# Patient Record
Sex: Male | Born: 1945 | Race: Black or African American | Hispanic: No | Smoking: Light tobacco smoker
Health system: Southern US, Community
[De-identification: ages and names within clinical notes are randomized; demographics above are authoritative.]

## PROBLEM LIST (undated history)

## (undated) DIAGNOSIS — G4733 Obstructive sleep apnea (adult) (pediatric): Secondary | ICD-10-CM

## (undated) DIAGNOSIS — J449 Chronic obstructive pulmonary disease, unspecified: Secondary | ICD-10-CM

## (undated) DIAGNOSIS — E119 Type 2 diabetes mellitus without complications: Secondary | ICD-10-CM

## (undated) DIAGNOSIS — F32A Depression, unspecified: Secondary | ICD-10-CM

## (undated) DIAGNOSIS — F0390 Unspecified dementia without behavioral disturbance: Secondary | ICD-10-CM

## (undated) DIAGNOSIS — I639 Cerebral infarction, unspecified: Secondary | ICD-10-CM

## (undated) DIAGNOSIS — I739 Peripheral vascular disease, unspecified: Secondary | ICD-10-CM

## (undated) DIAGNOSIS — I10 Essential (primary) hypertension: Secondary | ICD-10-CM

## (undated) DIAGNOSIS — N189 Chronic kidney disease, unspecified: Secondary | ICD-10-CM

## (undated) DIAGNOSIS — E785 Hyperlipidemia, unspecified: Secondary | ICD-10-CM

## (undated) DIAGNOSIS — I4891 Unspecified atrial fibrillation: Secondary | ICD-10-CM

## (undated) DIAGNOSIS — F329 Major depressive disorder, single episode, unspecified: Secondary | ICD-10-CM

## (undated) HISTORY — PX: HIP FRACTURE SURGERY: SHX118

## (undated) HISTORY — DX: Chronic kidney disease, unspecified: N18.9

## (undated) HISTORY — DX: Type 2 diabetes mellitus without complications: E11.9

## (undated) HISTORY — DX: Obstructive sleep apnea (adult) (pediatric): G47.33

---

## 1999-07-10 ENCOUNTER — Inpatient Hospital Stay (HOSPITAL_COMMUNITY): Admission: AD | Admit: 1999-07-10 | Discharge: 1999-07-15 | Payer: Self-pay | Admitting: Neurology

## 1999-07-11 ENCOUNTER — Encounter: Payer: Self-pay | Admitting: Neurology

## 1999-07-12 ENCOUNTER — Encounter: Payer: Self-pay | Admitting: Neurology

## 1999-07-15 ENCOUNTER — Inpatient Hospital Stay (HOSPITAL_COMMUNITY)
Admission: RE | Admit: 1999-07-15 | Discharge: 1999-10-19 | Payer: Self-pay | Admitting: Physical Medicine & Rehabilitation

## 1999-07-15 ENCOUNTER — Encounter: Payer: Self-pay | Admitting: Physical Medicine & Rehabilitation

## 1999-10-17 ENCOUNTER — Encounter: Payer: Self-pay | Admitting: Physical Medicine & Rehabilitation

## 2003-05-13 ENCOUNTER — Encounter
Admission: RE | Admit: 2003-05-13 | Discharge: 2003-08-11 | Payer: Self-pay | Admitting: Physical Medicine & Rehabilitation

## 2003-08-12 ENCOUNTER — Encounter
Admission: RE | Admit: 2003-08-12 | Discharge: 2003-11-10 | Payer: Self-pay | Admitting: Physical Medicine & Rehabilitation

## 2003-12-30 ENCOUNTER — Encounter
Admission: RE | Admit: 2003-12-30 | Discharge: 2004-03-02 | Payer: Self-pay | Admitting: Physical Medicine & Rehabilitation

## 2003-12-31 ENCOUNTER — Ambulatory Visit: Payer: Self-pay | Admitting: Physical Medicine & Rehabilitation

## 2004-03-02 ENCOUNTER — Encounter
Admission: RE | Admit: 2004-03-02 | Discharge: 2004-05-31 | Payer: Self-pay | Admitting: Physical Medicine & Rehabilitation

## 2004-03-04 ENCOUNTER — Ambulatory Visit: Payer: Self-pay | Admitting: Physical Medicine & Rehabilitation

## 2004-06-01 ENCOUNTER — Encounter
Admission: RE | Admit: 2004-06-01 | Discharge: 2004-08-30 | Payer: Self-pay | Admitting: Physical Medicine & Rehabilitation

## 2004-07-06 ENCOUNTER — Ambulatory Visit: Payer: Self-pay | Admitting: Physical Medicine & Rehabilitation

## 2004-12-28 ENCOUNTER — Encounter
Admission: RE | Admit: 2004-12-28 | Discharge: 2005-03-28 | Payer: Self-pay | Admitting: Physical Medicine & Rehabilitation

## 2004-12-28 ENCOUNTER — Ambulatory Visit: Payer: Self-pay | Admitting: Physical Medicine & Rehabilitation

## 2005-07-26 ENCOUNTER — Encounter
Admission: RE | Admit: 2005-07-26 | Discharge: 2005-10-24 | Payer: Self-pay | Admitting: Physical Medicine & Rehabilitation

## 2005-07-26 ENCOUNTER — Ambulatory Visit: Payer: Self-pay | Admitting: Physical Medicine & Rehabilitation

## 2010-01-06 ENCOUNTER — Emergency Department (HOSPITAL_COMMUNITY): Admission: EM | Admit: 2010-01-06 | Discharge: 2010-01-06 | Payer: Self-pay | Admitting: Emergency Medicine

## 2010-07-10 NOTE — Assessment & Plan Note (Signed)
HISTORY:  The patient is an old patient of mine who was last seen in March  2003 for ongoing spasticity in the left side due to a stroke.  The patient  had some successful Botox injections.  I have not seen him back since March  2003.  He is still at the [facility] receiving 24-hour care.  He is able  to walk with assistance and perform some of his ADL's, including toileting  independently.  He needs help with some grooming tasks and his dressing.  He  continues to have pain in the left shoulder which is due to tone and  adhesive capsulitis.  He also has ongoing problems with the left elbow due  to chronic tonicity.   CURRENT MEDICATIONS:  He is currently taking Zanaflex for spasticity, and  his dose is 8 mg b.i.d.  He is not using anything for p.r.n. spasms.  He  remains on chronic Coumadin for anticoagulation.  The patient has some tightness in the left leg as well, but this does not  seem to effect him as the tone in his left upper extremity does.   REVIEW OF SYSTEMS:  The patient denies any chest pain, shortness of breath,  coughing, wheezing, cold or flu symptoms.  He has had no seizures, new  weakness, numbness or dizziness.  No vertigo, confusion, problems with  vision.  Mood has been stable.  He denies any problems with sleep or  agitation.  He has had no nausea or vomiting, reflux, diarrhea, or  constipation.  No recent fevers, weight loss, or weight gain.   PHYSICAL EXAMINATION:  GENERAL:  The patient is pleasant and in no acute  distress.  VITAL SIGNS:  Blood pressure 124/76, pulse 65, saturation 100% on room air.  NEUROLOGIC:  The patient is in his wheelchair.  He had a hard time  performing transfers on his limb.  His affect was flat as it usually has  been.  His appearance was normal generally.  He had an Geophysicist/field seismologist with him  today.  His left upper extremity tone ranged from 1+ to 2/4.  There was  impaired range of motion of the left shoulder in all planes.  He had  definite tightness at the left pectoralis major as well as the left biceps  brachi.  Wrist and finger flexors were a little less hypertonic today.  Motor strength was in the range of 1+ to 2/5 throughout the left upper  extremity.  The left lower extremity strength is trace to absent.  He had  trace to absent motor function distally proximally and 2/5 strength.  The  tone was 1 to 1+/4.  Sensory examination revealed 1/2 sensation to pinprick  and light touch on the left side.  Reflexes were hyperactive throughout.  The remainder of his neurological examination was normal, including the  cranial nerves today.  Cognitive ___________ appropriate, although he was  flat.   ASSESSMENT:  1. Status post right cerebrovascular accident.  2. Spastic left hemiparesis.  3. Ongoing flat affect.   PLAN:  1. We will stop the Zanaflex today.  2. Begin him on Dantrium 25 mg b.i.d. and increase to 25 mg t.i.d. after one     week.  3. I will set him up for Botox injections of the left biceps and left     pectoralis major using 300 units in total.  4. I would like to follow up the injections with therapy, which is     apparently available  at the [facility].      Ranelle Oyster, M.D.   ZTS/MedQ  D:  05/14/2003 12:54:01  T:  05/14/2003 14:26:22  Job #:  [id number]   cc:   [facility]

## 2010-07-10 NOTE — Assessment & Plan Note (Signed)
Derrick Howell is back regarding his left spastic hemiparesis. He has failed multiple  other measures in the past over the long-term because of the lack of  initiation. Derrick Howell comes back today without significant change. He remains on  Dantrium 25 mg q.i.d. He is up at a very limited basis at the [facility]  where he lives. He has some one walk him on occasion. He has limited social  contacts in general. He remains on Lexapro for mood.   The patient denies pain today. He is sleeping well. Feels like his mood has  been stable.   REVIEW OF SYSTEMS:  The patient reports trouble walking. Labile sugars. He  denies any new constitutional, GI, GU, or cardiorespiratory complaints  today.   SOCIAL HISTORY:  Without significant change.   PHYSICAL EXAMINATION:  Blood pressure 126/65, pulse 68, respiratory rate 16,  saturation 99% on room air. The patient is pleasant, flat, and in no acute  distress. He is alert and oriented times three. Tone remains stable in the  left biceps at 2/4. Strength in the left upper extremity is 1/5. The left  lower extremity tone is 1/4 with 2 to 3 out of 5 strength. The patient is  able to stand unassisted today from a wheelchair. Cranial nerves exam is  stable. Heart has a regular rate and rhythm. Lungs are clear.   ASSESSMENT:  1.  Status post right cerebrovascular accident.  2.  Spastic left hemiparesis.  3.  Depression.   PLAN:  1.  Taper off Dantrium over the next months' time as the patient is not      benefiting from oral medications. I will not seek further injections as      he does not follow through therapy-wise afterwards.  2.  Continue Lexapro for mood.  3.  The patient may follow up for further medical needs through a physician      at the [facility].      Ranelle Oyster, M.D.  Electronically Signed     ZTS/MedQ  D:  07/26/2005 16:15:05  T:  07/27/2005 12:20:47  Job #:  [id number]

## 2010-07-10 NOTE — Assessment & Plan Note (Signed)
Derrick Howell is back regarding his left sided spastic hemiparesis. He has had some  mild results with the left biceps injection and had good results with the  left pectoralis major injection. He has been getting some therapy at the  [facility] to address this. It does not sound like they have been overly  aggressive with the treatment, however. The patient denies pain. He is  tolerating the Dantrium at 25 mg t.i.d. currently without side effects.   REVIEW OF SYSTEMS:  The patient denies any chest pain, shortness of breath,  cold, flu, wheezing, or coughing symptoms. He denies seizures, weakness, new  spasms, stroke, vertigo, confusion, problem with depression, anxiety, or  agitation. Denies nausea, vomiting, problems with urination, diarrhea,  constipation, bowel or bladder incontinence. The patient denies fevers,  chills, weight changes, or sweating.   PHYSICAL EXAMINATION:  Blood pressure 133/69, pulse 85, respiratory rate 22,  saturating 97% on room air. The patient is seated in his wheelchair. Affect  remains overly flat but patient laughs at times. Assistant accompanies him  today. Tone still ranged in the 1+ to 2/4 range. Left shoulder and  pectoralis major were much improved from last visit. I felt that there was  mild improvement with biceps which I was able to range to neutral with  significant resistance. Motor strength remained 1+ to 2+/5 in the left upper  extremity. Left lower extremity remained 2+ to 3/5 with trace tone to 1/4  tone. Sensory exam was 1/2 for pin prick and light touch in the left.  Reflexes remain hyperactive on the left. Cranial nerve exam was intact.  Cognitive function appeared fairly appropriate although patient remains  flat.   ASSESSMENT:  1. Status post right cerebral vascular accident.  2. Spastic left hemiparesis.  3. Depression.   PLAN:  1. Continue with the Dantrium at 25 mg but increase to q.i.d. schedule.  2. Will repeat Botox injections, left  biceps, and left pectoralis major     using 300 units total. May consider phenol injection to the biceps at a     later point.  3. Continue with therapies at the [facility].  4. Will see the patient back in about one month for his followup injections.      Ranelle Oyster, M.D.   ZTS/MedQ  D:  08/13/2003 14:18:34  T:  08/14/2003 07:55:12  Job #:  [id number]

## 2010-07-10 NOTE — Procedures (Signed)
NAME:  Derrick Howell, Derrick Howell NO.:  [id number]   MEDICAL RECORD NO.:  [id number]          PATIENT TYPE:  REC   LOCATION:  [facility]                          FACILITY:  [facility]   PHYSICIAN:  Ranelle Oyster, M.D.DATE OF BIRTH:  [birth year]   DATE OF PROCEDURE:  03/04/2004  DATE OF DISCHARGE:                                 OPERATIVE REPORT   DIAGNOSIS:  Spastic left hemiparesis, ICD-9 342.12.   PROCEDURE:  Phenol injection.   DESCRIPTION OF PROCEDURE:  Left axilla was prepped with Betadine and  alcohol.  We identified the brachial artery and the surface electrode, the  musculocutaneous nerve's general area.  We inserted a 50 mm, 26-gauge  injectable electrode anterior to the brachial artery until we localized the  musculocutaneous nerve at the smallest possible amplitude.  After needle  aspiration, we injected 5 mL of aqueous phenol solution into the area  surrounding the musculocutaneous nerve.  The patient had instant relief with  decreased spasms in the arm and biceps in particular.  The patient was  cleaned and the area was not dressed as no drainage was noted.  He was given  post-injection instructions.   I will see the patient back in approximately three months' time.      Ian Malkin   ZTS/MEDQ  D:  03/04/2004 13:00:09  T:  03/04/2004 13:18:15  Job:  [id number]
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
## 2010-07-10 NOTE — Assessment & Plan Note (Signed)
MEDICAL RECORD NUMBER:  [id number]   Mr. Derrick Howell is back regarding his left spastic hemiparesis. We performed  phenol injections last spring with good results. The following May he was  still doing fairly well in regards to his tone. He is now here for a 6 month  follow-up where he is doing a lot of the same things he was dong before from  a standpoint of minimal exercise. He lacks motivation. He continues on  Lexapro for his mood. These issues have been problems ever since I followed  this patient even on inpatient rehab. He is doing some token stretches to  the left upper extremity and doing some walking on occasion but it does not  really amount to much.   The patient denies any pain. He states that he is sleeping well. He remains  on Dantrium 25 mg t.i.d. for tone control, as well as Lexapro 10 mg daily.  He is on other medications per physician at the nursing home.   REVIEW OF SYSTEMS:  Negative for any new neurological, psychiatric,  constitutional, GU, GI, or cardiorespiratory complaints. The patient denies  that he is depressed.   SOCIAL HISTORY:  The patient remains in the nursing home and has made no  attempts to leave the facility.   PHYSICAL EXAMINATION:  Blood pressure is 123/69, pulse is 98, respiratory  rate is 16, he is saturating 97% in room air. The patient is pleasant, in no  acute distress. He is alert and oriented x3. Affect is flat. He appears  fairly alert. The patient is in a wheelchair today. He has significant tone  along the left leg and arm today at varying levels of trace to 2+/4. His  volitional movement of the left upper extremity is 1/5. Left lower extremity  is 1-2/5. Left biceps tone had worsened again and is 2/4 particularly. He  had some tightness also in the left pectoralis major. Skin was intact.  Cranial nerve exam findings were unchanged. Heart was regular rate and  rhythm, lungs were clear, abdomen soft and nontender.   ASSESSMENT:  1.  Status  post right cerebrovascular accident.  2.  Spastic left hemiparesis.  3.  Depression.   PLAN:  1.  Continue Dantrium but increase to 25 mg q.i.d. I hesitate to do more      phenol or Botox injections as the patient simply does not follow through      with his post injection program. I did ask physical and occupational      therapy at the facility to provide him some written instructions for      exercises. We had a long talk about the patient taking some of the      burden upon himself.  2.  Continue Lexapro. Consider increasing the dose or change it to another      agent. I will leave this up to his physician at the facility.  3.  Will check liver function tests and CBC as well.  4.  I will see the patient back in about 6 months' time.      Ranelle Oyster, M.D.  Electronically Signed     ZTS/MedQ  D:  12/29/2004 12:26:43  T:  12/29/2004 13:37:36  Job #:  [id number]

## 2010-07-10 NOTE — H&P (Signed)
[facility]  Patient:    Derrick Howell, Derrick Howell                       MRN: [id number] Adm. Date:  [id number] Attending:  Glean Hess D                         History and Physical  CHIEF COMPLAINT:  Left-sided weakness.  HISTORY OF PRESENT ILLNESS:  Patient is a 65 year old man who has been transferred to my care from [facility] Room for a history, as patient refers, around 2:30 p.m. this afternoon, he was mowing the lawn in his back yard and accidentally cut his left hand, suffering a laceration about 2 cm in length, which has already been sutured at [facility]. Shortly after this, patient developed weakness bilaterally.  He felt very sick, had nausea and vomited x 3, and had vertigo.  He was taken by EMS and at the emergency room, he was noted by the medical staff to be weak on his left side, including the face, arm and leg.  He has not had a prior history of strokes or TIAs.  He did not lose consciousness.  There are no reported seizures.  PAST MEDICAL HISTORY:  Hypertension.  MEDICATIONS:  He has been off antihypertensives for about a month.  PRIMARY CARE PHYSICIAN:  ______ .  SOCIAL HISTORY:  He is separated from his wife.  He lives with his brother. He smokes about one pack per day and drinks about two to three beers on a daily basis.  FAMILY HISTORY:  Mother -- diabetes mellitus.  ALLERGIES:  No known drug allergies.  REVIEW OF SYSTEMS:  As per history of present illness.  PHYSICAL EXAMINATION:  VITAL SIGNS:  Blood pressure 150/84, pulse 95, respirations 22, temperature 97.9.  GENERAL:  Patient is lying on the bed in no distress.  HEENT:  Head normocephalic, atraumatic.  NECK:  Supple without bruits.  LUNGS:  Clear bilaterally.  HEART:  Heart sounds regular and rhythmic with a systolic ejection murmur in the apex, 2/6.  ABDOMEN:  Soft.  Bowel sounds are present.  No visceromegaly.  EXTREMITIES:  There is a  bandage around his left hand covering his suture. There is no cyanosis or edema.  NEUROLOGIC:  He is awake and alert.  His speech is dysarthric.  He follows commands.  Pupils are equal and reactive bilaterally.  Extraoculocephalic movements intact.  Face is asymmetric with a left facial central palsy.  He is dysphagic.  His motor examination shows the left hemiparesis involving the arm and leg, most notably in the arm, strength 2-3/5, and left leg 4/5.  DTRs +1 throughout.  Plantars downgoing.  Gait evaluation was deferred.  NEUROIMAGING STUDIES:  I have personally reviewed a copy of the CT scan provided from [facility] which shows no acute ischemic changes and no hemorrhages.  LABORATORY DATA:  Obvious irregular laboratory data notably is only an alcohol level of 11.7.  EKG:  Normal sinus rhythm and nonspecific ST changes.  IMPRESSION: 1. Stroke, vertebrobasilar distribution. 2. Hypertension. 3. Tobacco dependence. 4. Alcohol dependence.  PLAN AND RECOMMENDATIONS:  The diagnosis, condition and further interventions were discussed at length with the patient.  Patient is admitted to the hospital for further workup and testing for cerebrovascular disease. Management will consist of hemodynamic support with IV fluids of normal saline at 100 cc/hr, oxygen at 2 L and heparin ischemic  stroke protocol.  Will perform noninvasive imaging of the intra- and extracranial vessels with an MRI/MRA of the brain; also, a 2-D echocardiogram is recommended for evaluation of embolic sources as a cause of his stroke.  Patient will remain on heparin until his workup is completed.  Will also obtain speech therapy evaluation for swallowing in the morning and PT and OT consultations. DD:  07/10/99 TD:  07/11/99 Job: [id number]

## 2010-07-10 NOTE — Discharge Summary (Signed)
[place]  Patient:    Derrick Howell, Derrick Howell                       MRN: [id number] Adm. Date:  [id number] Disc. Date: 07/15/99 Attending:  Faith Rogue T                           Discharge Summary  DIAGNOSES:  1. Right middle cerebral artery distribution infarction.  2. 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  3. Hypertension.  4. Tobacco dependence.  5. Alcohol dependence.  PROCEDURES/INTERVENTIONS:  1. MRI/MRA of the brain.  2. 2D echocardiogram.  HISTORY OF PRESENT ILLNESS: The patient is a 65 year old man who was transferred from [facility] for a chief complaint of left-sided weakness the morning of admission.  Reportedly this patient was in his back yard mowing the lawn when he accidentally suffered a laceration of his left hand.  Shortly after this he became weak on his left side and fell to the ground.  He was initially taken to [facility] and then transferred to [facility] for further evaluation.  Initial CAT scan of the brain was normal.  PHYSICAL EXAMINATION: On examination on admission he was awake and alert, oriented.  Speech was dysarthric.  He was dysphasic, which facial central weakness on the left side.  Motor examination showed strength to be 2-3/5 on the left side, involving the arm more than the leg.  HOSPITAL COURSE: The patient was admitted to the hospital for for further testing and underwent noninvasive imaging of the intracranial and extracranial vessels.  The patients MRA showed bilateral MCA occlusions and MRI showed an infarction of the right middle cerebral artery distribution as well.  He also underwent a 2D echocardiogram, which is pending at the time of this dictation. During his hospital stay his neurologic status actually worsened.  He became weaker on his left side and became almost flaccid, with no strength.  He was treated with IV fluids for hemodynamic support, IV  heparin, and was switched to Coumadin for secondary stroke prevention.  After PT and OT evaluation it was recommended for him to have in-patient rehabilitation services.  DISCHARGE MEDICATIONS:  1. Coumadin, adjusted for INR of 2-3.  2. Altace 2.5 mg q.d.  3. Aspirin 81 mg q.d. DD:  07/15/99 TD:  07/18/99 Job: [id number]

## 2010-07-10 NOTE — Assessment & Plan Note (Signed)
HISTORY:  Derrick Howell is back regarding his spastic left hemiparesis.  We  performed a phenol injection on the musculocutaneous nerve.  The patient  tolerated this well and has had good results.  The left arm range of motion  is improved.  He is not active, however, with exercises.  He is walking  daily with his cane.  He is able to walk up and down the hallway apparently,  which is a pretty long distance.  He denies pain.  He is not using his left  arm for activities.  He is able to go to the bathroom independently.  He  still needs assistance with some of his dressing.  The patient would like to  go home.   SOCIAL HISTORY:  The patient has not spoken with the case manager or social  worker regarding return to an independent living arrangement.  He continues  to smoke 1/3 pack of cigarettes per day.   REVIEW OF SYSTEMS:  Negative, other than that mentioned above.  A full  review of systems is in the health and history section of the chart.   PHYSICAL EXAMINATION:  VITAL SIGNS:  Blood pressure 116/76, pulse 73,  respirations 16, saturation 100% on room air.  NEUROLOGIC/EXTREMITIES:  The patient stood for me independently.  With a cue  he was able to straighten out the left leg fairly easily.  He continues to  have 2/4 strength on the left upper extremity with occasional 1/4 strength  at the wrist and finger flexors.  The left lower extremity is 2 to 2+/5.  It  activates better in the standing position.  Cranial nerve examination was  stable.  Sensory exam was slightly diminished in the left side.  He remains  hyperactive on the left with reflexes.  His tone is 1 to 1+/4 at the elbow.  The left knee tone was also 1+/4.  ABDOMEN:  Soft, nontender.  HEART:  A regular rate and rhythm.  LUNGS:  Clear.  SKIN:  Intact.  GENERAL:  The patient remains flat with affect and is generally indifferent  about important topics we discussed.   ASSESSMENT:  1.  Status post right cerebrovascular  accident.  2.  Spastic left hemiparesis.  3.  Depression.   PLAN:  1.  Continue Dantrium for muscle tone.  The patient has had a good response      with the phenol injections.  He needs to work on actively using the      upper extremity.  This will not get better on its own.  2.  Continue ambulation with the cane.  He has made good strides in this      area.  3.  I would case management/social work to work with the patient on getting      him into an independent living or an assisted living environment.  He is      no longer in need of rest home level care in my opinion.  The patient      would like to go home.  4.  I will see the patient back in six months' time.      ZTS/MedQ  D:  07/06/2004 11:34:16  T:  07/06/2004 12:14:36  Job #:  [id number]

## 2013-03-15 ENCOUNTER — Encounter (HOSPITAL_COMMUNITY): Payer: Self-pay | Admitting: Emergency Medicine

## 2013-03-15 ENCOUNTER — Emergency Department (HOSPITAL_COMMUNITY)
Admission: EM | Admit: 2013-03-15 | Discharge: 2013-03-16 | Disposition: A | Discharge status: Home / Self Care | Payer: Medicare Other | Attending: Emergency Medicine | Admitting: Emergency Medicine

## 2013-03-15 DIAGNOSIS — R112 Nausea with vomiting, unspecified: Secondary | ICD-10-CM | POA: Insufficient documentation

## 2013-03-15 DIAGNOSIS — Z8673 Personal history of transient ischemic attack (TIA), and cerebral infarction without residual deficits: Secondary | ICD-10-CM | POA: Insufficient documentation

## 2013-03-15 DIAGNOSIS — F329 Major depressive disorder, single episode, unspecified: Secondary | ICD-10-CM | POA: Insufficient documentation

## 2013-03-15 DIAGNOSIS — I4891 Unspecified atrial fibrillation: Secondary | ICD-10-CM | POA: Insufficient documentation

## 2013-03-15 DIAGNOSIS — I10 Essential (primary) hypertension: Secondary | ICD-10-CM | POA: Insufficient documentation

## 2013-03-15 DIAGNOSIS — J449 Chronic obstructive pulmonary disease, unspecified: Secondary | ICD-10-CM | POA: Insufficient documentation

## 2013-03-15 DIAGNOSIS — E785 Hyperlipidemia, unspecified: Secondary | ICD-10-CM | POA: Insufficient documentation

## 2013-03-15 DIAGNOSIS — J4489 Other specified chronic obstructive pulmonary disease: Secondary | ICD-10-CM | POA: Insufficient documentation

## 2013-03-15 DIAGNOSIS — F3289 Other specified depressive episodes: Secondary | ICD-10-CM | POA: Insufficient documentation

## 2013-03-15 DIAGNOSIS — Z7901 Long term (current) use of anticoagulants: Secondary | ICD-10-CM | POA: Insufficient documentation

## 2013-03-15 DIAGNOSIS — Z79899 Other long term (current) drug therapy: Secondary | ICD-10-CM | POA: Insufficient documentation

## 2013-03-15 HISTORY — DX: Essential (primary) hypertension: I10

## 2013-03-15 HISTORY — DX: Hyperlipidemia, unspecified: E78.5

## 2013-03-15 HISTORY — DX: Depression, unspecified: F32.A

## 2013-03-15 HISTORY — DX: Chronic obstructive pulmonary disease, unspecified: J44.9

## 2013-03-15 HISTORY — DX: Unspecified atrial fibrillation: I48.91

## 2013-03-15 HISTORY — DX: Cerebral infarction, unspecified: I63.9

## 2013-03-15 HISTORY — DX: Major depressive disorder, single episode, unspecified: F32.9

## 2013-03-15 NOTE — ED Notes (Signed)
Patient presents to ER via [facility] with [facility].  EMS was called for chest pain; however, when EMS arrived, patient had no chest pain.  Patient c/o generalized weakness and vomited x 1 at [facility].  Patient denies nausea at this time.  Patient has history of CVA and has left sided deficits from previous CVA.

## 2013-03-16 LAB — GLUCOSE, CAPILLARY: Glucose-Capillary: 121 mg/dL — ABNORMAL HIGH (ref 70–99)

## 2013-03-16 MED ORDER — ONDANSETRON 8 MG PO TBDP
8.0000 mg | ORAL_TABLET | Freq: Once | ORAL | Status: AC
  Administered 2013-03-16: 8 mg via ORAL
  Filled 2013-03-16: qty 1

## 2013-03-16 MED ORDER — ONDANSETRON 8 MG PO TBDP: 8.0000 mg | ORAL_TABLET | Freq: Three times a day (TID) | ORAL | Status: DC | PRN

## 2013-03-16 NOTE — ED Notes (Signed)
Patient presents with no complaints at this time.  Patient states he had burning sensation in his chest and became nauseated.  Patient states he vomited and felt better at that time.

## 2013-03-16 NOTE — ED Provider Notes (Signed)
CSN: [id number]     Arrival date & time 03/15/13  2334 History   First MD Initiated Contact with Patient 03/16/13 0000      HPI Patient presents complaining of initial burning sensation in his anterior chest followed by 2 episodes of nausea and vomiting.  No hematemesis described.  He said food contents came out.  He states he feels much better at this time.  He denies chest pain or shortness of breath.  He denies nausea or vomiting at this time.  He denies diarrhea recent illness.  No recent sick contacts.  No fevers or chills.  No productive cough.  No myalgias.  No sore throat.  No recent sick contacts.  Symptoms are mild to moderate in severity when they were occurring earlier.  He currently he is without complaint.  He is not diabetic.   Past Medical History  Diagnosis Date  . Hyperlipidemia   . Hypertension   . COPD (chronic obstructive pulmonary disease)   . Stroke   . Atrial fibrillation   . Depression    History reviewed. No pertinent past surgical history. No family history on file. History  Substance Use Topics  . Smoking status: Never Smoker   . Smokeless tobacco: Not on file  . Alcohol Use: No    Review of Systems  All other systems reviewed and are negative.    Allergies  Review of patient's allergies indicates no known allergies.  Home Medications   Current Outpatient Rx  Name  Route  Sig  Dispense  Refill  . acetaminophen (TYLENOL) 500 MG tablet   Oral   Take 500 mg by mouth every 4 (four) hours as needed.         Marland Kitchen. alum & mag hydroxide-simeth (MAALOX/MYLANTA) 200-200-20 MG/5ML suspension   Oral   Take by mouth every 6 (six) hours as needed for indigestion or heartburn.         . cloNIDine (CATAPRES - DOSED IN MG/24 HR) 0.2 mg/24hr patch   Transdermal   Place 0.2 mg onto the skin once a week.         . diltiazem (CARDIZEM) 120 MG tablet   Oral   Take 240 mg by mouth 2 (two) times daily.         Marland Kitchen. escitalopram (LEXAPRO) 5 MG tablet   Oral   Take 5 mg by mouth daily.         . fenofibrate (TRICOR) 145 MG tablet   Oral   Take 145 mg by mouth daily.         Marland Kitchen. guaiFENesin (ROBITUSSIN) 100 MG/5ML liquid   Oral   Take 200 mg by mouth 4 (four) times daily as needed for cough.         . hydrALAZINE (APRESOLINE) 50 MG tablet   Oral   Take 50 mg by mouth 3 (three) times daily.         . hydrochlorothiazide (HYDRODIURIL) 25 MG tablet   Oral   Take 25 mg by mouth daily.         Marland Kitchen. lisinopril (PRINIVIL,ZESTRIL) 40 MG tablet   Oral   Take 40 mg by mouth daily.         . magnesium hydroxide (MILK OF MAGNESIA) 400 MG/5ML suspension   Oral   Take 30 mLs by mouth at bedtime as needed for mild constipation.         . metoprolol (LOPRESSOR) 50 MG tablet   Oral   Take 50 mg  by mouth 2 (two) times daily.         . pramipexole (MIRAPEX) 0.5 MG tablet   Oral   Take 0.5 mg by mouth every evening.         . ranitidine (ZANTAC) 150 MG capsule   Oral   Take 150 mg by mouth daily.         . rivaroxaban (XARELTO) 10 MG TABS tablet   Oral   Take 20 mg by mouth daily.         . simvastatin (ZOCOR) 20 MG tablet   Oral   Take 20 mg by mouth daily.         . traMADol (ULTRAM) 50 MG tablet   Oral   Take by mouth 3 (three) times daily.          BP 163/75  Pulse 74  Temp(Src) 98.4 F (36.9 C) (Oral)  SpO2 97% Physical Exam  Nursing note and vitals reviewed. Constitutional: He is oriented to person, place, and time. He appears well-developed and well-nourished.  HENT:  Head: Normocephalic and atraumatic.  Eyes: EOM are normal.  Neck: Normal range of motion.  Cardiovascular: Normal rate, regular rhythm, normal heart sounds and intact distal pulses.   Pulmonary/Chest: Effort normal and breath sounds normal. No respiratory distress.  Abdominal: Soft. He exhibits no distension. There is no tenderness.  Musculoskeletal: Normal range of motion.  Neurological: He is alert and oriented to person, place, and  time.  Skin: Skin is warm and dry.  Psychiatric: He has a normal mood and affect. Judgment normal.    ED Course  Procedures (including critical care time) Labs Review Labs Reviewed  GLUCOSE, CAPILLARY - Abnormal; Notable for the following:    Glucose-Capillary 121 (*)    All other components within normal limits   Imaging Review No results found.  EKG Interpretation    Date/Time:  Thursday March 15 2013 23:37:22 EST Ventricular Rate:  72 PR Interval:  230 QRS Duration: 104 QT Interval:  426 QTC Calculation: 466 R Axis:   -19 Text Interpretation:  Sinus rhythm with 1st degree A-V block Minimal voltage criteria for LVH, may be normal variant Borderline ECG No previous ECGs available Confirmed by Tucker Steedley  MD, Darcelle Herrada ([id number]) on 03/16/2013 12:03:00 AM            MDM   1. Nausea and vomiting    I suspect this is viral process with associated nausea vomiting.  Questionable GERD given the burning sensation.  Doubt ACS.  EKG normal.  No indication for additional testing in the emergency apartment.  Patient asymptomatic at this time.  Close PCP followup.  He understands to return to the ER for new or worsening symptoms    Lyanne Co, MD 03/16/13 [phone]

## 2013-03-16 NOTE — ED Notes (Signed)
EMS called for transport back to [facility]. 

## 2013-03-16 NOTE — Discharge Instructions (Signed)

## 2014-11-13 ENCOUNTER — Other Ambulatory Visit: Payer: Self-pay

## 2014-11-13 DIAGNOSIS — Z01810 Encounter for preprocedural cardiovascular examination: Secondary | ICD-10-CM

## 2014-11-13 DIAGNOSIS — N184 Chronic kidney disease, stage 4 (severe): Secondary | ICD-10-CM

## 2014-12-02 ENCOUNTER — Encounter: Payer: Self-pay | Admitting: Vascular Surgery

## 2014-12-03 ENCOUNTER — Inpatient Hospital Stay (HOSPITAL_COMMUNITY): Admission: RE | Admit: 2014-12-03 | Payer: Medicare Other | Source: Ambulatory Visit

## 2014-12-03 ENCOUNTER — Other Ambulatory Visit (HOSPITAL_COMMUNITY): Payer: Medicare Other

## 2014-12-03 ENCOUNTER — Ambulatory Visit: Payer: Medicare Other | Admitting: Vascular Surgery

## 2014-12-05 ENCOUNTER — Encounter: Payer: Self-pay | Admitting: Vascular Surgery

## 2014-12-10 ENCOUNTER — Ambulatory Visit: Payer: Medicare Other | Admitting: Vascular Surgery

## 2014-12-10 ENCOUNTER — Other Ambulatory Visit (HOSPITAL_COMMUNITY): Payer: Medicare Other

## 2014-12-10 ENCOUNTER — Encounter (HOSPITAL_COMMUNITY): Payer: Medicare Other

## 2014-12-20 ENCOUNTER — Encounter: Payer: Self-pay | Admitting: Surgery

## 2014-12-23 ENCOUNTER — Ambulatory Visit (HOSPITAL_COMMUNITY)
Admission: RE | Admit: 2014-12-23 | Discharge: 2014-12-23 | Disposition: A | Discharge status: Home / Self Care | Payer: Medicare Other | Source: Ambulatory Visit | Attending: Vascular Surgery | Admitting: Vascular Surgery

## 2014-12-23 ENCOUNTER — Ambulatory Visit (HOSPITAL_COMMUNITY)
Admission: RE | Admit: 2014-12-23 | Discharge: 2014-12-23 | Disposition: A | Discharge status: Home / Self Care | Payer: Medicare Other | Source: Ambulatory Visit | Attending: Surgery | Admitting: Surgery

## 2014-12-23 ENCOUNTER — Encounter: Payer: Self-pay | Admitting: Surgery

## 2014-12-23 ENCOUNTER — Ambulatory Visit: Payer: Medicare Other | Admitting: Surgery

## 2014-12-23 VITALS — BP 160/80 | HR 76 | Temp 97.8°F | Resp 16 | Ht 66.0 in | Wt 217.0 lb

## 2014-12-23 DIAGNOSIS — N184 Chronic kidney disease, stage 4 (severe): Secondary | ICD-10-CM

## 2014-12-23 DIAGNOSIS — I129 Hypertensive chronic kidney disease with stage 1 through stage 4 chronic kidney disease, or unspecified chronic kidney disease: Secondary | ICD-10-CM | POA: Insufficient documentation

## 2014-12-23 DIAGNOSIS — E119 Type 2 diabetes mellitus without complications: Secondary | ICD-10-CM | POA: Insufficient documentation

## 2014-12-23 DIAGNOSIS — Z01810 Encounter for preprocedural cardiovascular examination: Secondary | ICD-10-CM

## 2014-12-23 DIAGNOSIS — E785 Hyperlipidemia, unspecified: Secondary | ICD-10-CM | POA: Diagnosis not present

## 2014-12-23 NOTE — Progress Notes (Signed)
   Patient name: Glendon H Patman MRN: [id number] DOB: 10/18/1945 Sex: male   Referred by: Renal  Reason for referral:  Chief Complaint  Patient presents with  . New Evaluation    Stage 4 CKD;referred by Dr. Forencio Garcia for perm. access placement    HISTORY OF PRESENT ILLNESS: This is a 69-year-old gentleman with stage IV renal insufficiency he comes in today for consideration of dialysis access.  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  He lives in a nursing home.  Patient has a history of uncontrolled hypertension.  He also suffers from diabetes.  He has atrial fibrillation and takes Eliquis.  Past Medical History  Diagnosis Date  . Hyperlipidemia   . Hypertension   . COPD (chronic obstructive pulmonary disease) (HCC)   . Stroke (HCC)   . Atrial fibrillation (HCC)   . Depression   . OSA (obstructive sleep apnea)   . Diabetes mellitus without complication (HCC)   . CKD (chronic kidney disease)     No past surgical history on file.  Social History   Social History  . Marital Status: Married    Spouse Name: N/A  . Number of Children: N/A  . Years of Education: N/A   Occupational History  . Not on file.   Social History Main Topics  . Smoking status: Never Smoker   . Smokeless tobacco: Not on file  . Alcohol Use: No  . Drug Use: No  . Sexual Activity: Not on file   Other Topics Concern  . Not on file   Social History Narrative    No family history on file.  Allergies as of 12/23/2014  . (No Known Allergies)    Current Outpatient Prescriptions on File Prior to Visit  Medication Sig Dispense Refill  . diltiazem (CARDIZEM) 120 MG tablet Take 240 mg by mouth daily.     . fenofibrate (TRICOR) 145 MG tablet Take 145 mg by mouth daily.    . hydrALAZINE (APRESOLINE) 50 MG tablet Take 100 mg by mouth 4 (four) times daily.     . magnesium hydroxide (MILK OF MAGNESIA) 400 MG/5ML suspension Take 30 mLs by  mouth at bedtime as needed for mild constipation.    . metoprolol (LOPRESSOR) 50 MG tablet Take 100 mg by mouth 2 (two) times daily.     . ondansetron (ZOFRAN ODT) 8 MG disintegrating tablet Take 1 tablet (8 mg total) by mouth every 8 (eight) hours as needed for nausea or vomiting. 10 tablet 0  . pramipexole (MIRAPEX) 0.5 MG tablet Take 0.5 mg by mouth every evening.    . acetaminophen (TYLENOL) 500 MG tablet Take 500 mg by mouth every 4 (four) hours as needed.    . alum & mag hydroxide-simeth (MAALOX/MYLANTA) 200-200-20 MG/5ML suspension Take by mouth every 6 (six) hours as needed for indigestion or heartburn.    . cloNIDine (CATAPRES - DOSED IN MG/24 HR) 0.2 mg/24hr patch Place 0.2 mg onto the skin once a week.    . escitalopram (LEXAPRO) 5 MG tablet Take 5 mg by mouth daily.    . guaiFENesin (ROBITUSSIN) 100 MG/5ML liquid Take 200 mg by mouth 4 (four) times daily as needed for cough.    . hydrochlorothiazide (HYDRODIURIL) 25 MG tablet Take 25 mg by mouth daily.    . lisinopril (PRINIVIL,ZESTRIL) 40 MG tablet Take 40 mg by mouth daily.    . ranitidine (ZANTAC) 150 MG capsule Take 150   mg by mouth daily.    . rivaroxaban (XARELTO) 10 MG TABS tablet Take 20 mg by mouth daily.    . simvastatin (ZOCOR) 20 MG tablet Take 20 mg by mouth daily.    . traMADol (ULTRAM) 50 MG tablet Take by mouth 3 (three) times daily.     No current facility-administered medications on file prior to visit.     REVIEW OF SYSTEMS: Cardiovascular: No chest pain, chest pressure, palpitations, orthopnea, or dyspnea on exertion. No claudication or rest pain,  No history of DVT or phlebitis. Pulmonary: No productive cough, asthma or wheezing. Neurologic: Left-sided hemiparesis Hematologic: No bleeding problems or clotting disorders. Musculoskeletal: No joint pain or joint swelling. Gastrointestinal: No blood in stool or hematemesis Genitourinary: No dysuria or hematuria. Psychiatric:: No history of major  depression. Integumentary: No rashes or ulcers. Constitutional: No fever or chills.  PHYSICAL EXAMINATION:  Filed Vitals:   12/23/14 1331 12/23/14 1333  BP: 172/70 160/80  Pulse: 76   Temp: 97.8 F (36.6 C)   TempSrc: Oral   Resp: 16   Height: 5\' 6"  (1.676 m)   Weight: 217 lb (98.431 kg)    Body mass index is 35.04 kg/(m^2). General: The patient appears their stated age.   HEENT:  No gross abnormalities Pulmonary: Respirations are non-labored Musculoskeletal: There are no major deformities.   Neurologic: No focal weakness or paresthesias are detected, Skin: There are no ulcer or rashes noted. Psychiatric: The patient has normal affect. Cardiovascular: There is a regular rate and rhythm without significant murmur appreciated.  Palpable right brachial and radial pulse  Diagnostic Studies: I have reviewed his ultrasound studies.  He has biphasic radial artery and brachial artery waveforms. Vein mapping could not be performed of his left arm because of his contracture.  His cephalic vein on the right measures between 0.2 and 0.3 cm.  The basilic vein ranges in diameter from 0.45-0.84 cm.    Assessment:  Stage IV renal disease Plan: I discussed proceeding with a right arm fistula.  He is not a candidate for a left arm procedure given his contracture.  Most likely, this will be a first stage right basilic vein transposition, however I will evaluate his cephalic vein in the operating room and potentially perform brachiocephalic fistula if the vein is adequate.  If not he will need a 2 stage basilic vein procedure.  The risks and benefits of the procedure were discussed with the patient including the risk of non-maturity and the risk of steal syndrome.  I have scheduled his procedure for Friday, November 11.  I will stop his anticoagulation appropriately.     Jorge NyV. Wells Gumecindo Hopkin IV, M.D. [facility] [place] Office: [phone] Pager:  [phone]

## 2014-12-23 NOTE — Progress Notes (Signed)
Filed Vitals:   12/23/14 1331 12/23/14 1333  BP: 172/70 160/80  Pulse: 76   Temp: 97.8 F (36.6 C)   TempSrc: Oral   Resp: 16   Height: 5\' 6"  (1.676 m)   Weight: 217 lb (98.431 kg)

## 2014-12-24 ENCOUNTER — Other Ambulatory Visit: Payer: Self-pay

## 2014-12-25 ENCOUNTER — Ambulatory Visit: Payer: Medicare Other | Admitting: Vascular Surgery

## 2014-12-25 ENCOUNTER — Encounter (HOSPITAL_COMMUNITY): Payer: Medicare Other

## 2014-12-25 ENCOUNTER — Other Ambulatory Visit (HOSPITAL_COMMUNITY): Payer: Medicare Other

## 2014-12-30 ENCOUNTER — Encounter: Payer: Self-pay | Admitting: Internal Medicine

## 2015-01-02 ENCOUNTER — Encounter (HOSPITAL_COMMUNITY): Payer: Self-pay | Admitting: *Deleted

## 2015-01-02 MED ORDER — DEXTROSE 5 % IV SOLN: 1.5000 g | INTRAVENOUS | Status: DC

## 2015-01-02 NOTE — Progress Notes (Addendum)
I spoke with Lupita Leashonna, a nurse [facility] where [facility].  Lupita LeashDonna reported that patient's family will be bring him to the hospital in am.  Lupita LeashDonna could not tell me who his Drs are or if patient had an EKG in the last year.I faxed a request to Dr Georg RuddleForencio Garcia for records.

## 2015-01-03 ENCOUNTER — Ambulatory Visit (HOSPITAL_COMMUNITY): Payer: Medicare Other | Admitting: Anesthesiology

## 2015-01-03 ENCOUNTER — Encounter (HOSPITAL_COMMUNITY): Payer: Self-pay | Admitting: *Deleted

## 2015-01-03 ENCOUNTER — Other Ambulatory Visit: Payer: Medicare Other | Admitting: *Deleted

## 2015-01-03 ENCOUNTER — Ambulatory Visit (HOSPITAL_COMMUNITY)
Admission: RE | Admit: 2015-01-03 | Discharge: 2015-01-03 | Disposition: A | Discharge status: Skilled Nursing Facility | Payer: Medicare Other | Source: Ambulatory Visit | Attending: Surgery | Admitting: Surgery

## 2015-01-03 ENCOUNTER — Encounter (HOSPITAL_COMMUNITY)
Admission: RE | Disposition: A | Discharge status: Skilled Nursing Facility | Payer: Self-pay | Source: Ambulatory Visit | Attending: Surgery

## 2015-01-03 DIAGNOSIS — N186 End stage renal disease: Secondary | ICD-10-CM

## 2015-01-03 DIAGNOSIS — E1122 Type 2 diabetes mellitus with diabetic chronic kidney disease: Secondary | ICD-10-CM | POA: Insufficient documentation

## 2015-01-03 DIAGNOSIS — I4891 Unspecified atrial fibrillation: Secondary | ICD-10-CM | POA: Insufficient documentation

## 2015-01-03 DIAGNOSIS — Z794 Long term (current) use of insulin: Secondary | ICD-10-CM | POA: Insufficient documentation

## 2015-01-03 DIAGNOSIS — I129 Hypertensive chronic kidney disease with stage 1 through stage 4 chronic kidney disease, or unspecified chronic kidney disease: Secondary | ICD-10-CM | POA: Insufficient documentation

## 2015-01-03 DIAGNOSIS — E785 Hyperlipidemia, unspecified: Secondary | ICD-10-CM | POA: Diagnosis not present

## 2015-01-03 DIAGNOSIS — J449 Chronic obstructive pulmonary disease, unspecified: Secondary | ICD-10-CM | POA: Diagnosis not present

## 2015-01-03 DIAGNOSIS — Z7901 Long term (current) use of anticoagulants: Secondary | ICD-10-CM | POA: Insufficient documentation

## 2015-01-03 DIAGNOSIS — Z6835 Body mass index (BMI) 35.0-35.9, adult: Secondary | ICD-10-CM | POA: Diagnosis not present

## 2015-01-03 DIAGNOSIS — N184 Chronic kidney disease, stage 4 (severe): Secondary | ICD-10-CM

## 2015-01-03 DIAGNOSIS — I69354 Hemiplegia and hemiparesis following cerebral infarction affecting left non-dominant side: Secondary | ICD-10-CM | POA: Insufficient documentation

## 2015-01-03 DIAGNOSIS — Z79899 Other long term (current) drug therapy: Secondary | ICD-10-CM | POA: Insufficient documentation

## 2015-01-03 DIAGNOSIS — G4733 Obstructive sleep apnea (adult) (pediatric): Secondary | ICD-10-CM | POA: Diagnosis not present

## 2015-01-03 DIAGNOSIS — F329 Major depressive disorder, single episode, unspecified: Secondary | ICD-10-CM | POA: Diagnosis not present

## 2015-01-03 DIAGNOSIS — Z4931 Encounter for adequacy testing for hemodialysis: Secondary | ICD-10-CM

## 2015-01-03 HISTORY — PX: AV FISTULA PLACEMENT: SHX1204

## 2015-01-03 HISTORY — DX: Peripheral vascular disease, unspecified: I73.9

## 2015-01-03 LAB — PROTIME-INR
INR: 0.97 (ref 0.00–1.49)
Prothrombin Time: 13.1 seconds (ref 11.6–15.2)

## 2015-01-03 LAB — POCT I-STAT 4, (NA,K, GLUC, HGB,HCT)
Glucose, Bld: 150 mg/dL — ABNORMAL HIGH (ref 65–99)
HEMATOCRIT: 31 % — AB (ref 39.0–52.0)
HEMOGLOBIN: 10.5 g/dL — AB (ref 13.0–17.0)
POTASSIUM: 3.9 mmol/L (ref 3.5–5.1)
SODIUM: 142 mmol/L (ref 135–145)

## 2015-01-03 LAB — GLUCOSE, CAPILLARY
Glucose-Capillary: 133 mg/dL — ABNORMAL HIGH (ref 65–99)
Glucose-Capillary: 125 mg/dL — ABNORMAL HIGH (ref 65–99)

## 2015-01-03 SURGERY — ARTERIOVENOUS (AV) FISTULA CREATION
Anesthesia: Monitor Anesthesia Care | Site: Arm Upper | Laterality: Right

## 2015-01-03 MED ORDER — HEPARIN SODIUM (PORCINE) 1000 UNIT/ML IJ SOLN
INTRAMUSCULAR | Status: DC | PRN
  Administered 2015-01-03: 3000 [IU] via INTRAVENOUS

## 2015-01-03 MED ORDER — SODIUM CHLORIDE 0.9 % IV SOLN: INTRAVENOUS | Status: DC

## 2015-01-03 MED ORDER — OXYCODONE HCL 5 MG PO TABS
ORAL_TABLET | ORAL | Status: AC
  Filled 2015-01-03: qty 1

## 2015-01-03 MED ORDER — PROTAMINE SULFATE 10 MG/ML IV SOLN
INTRAVENOUS | Status: AC
  Filled 2015-01-03: qty 5

## 2015-01-03 MED ORDER — FENTANYL CITRATE (PF) 100 MCG/2ML IJ SOLN
INTRAMUSCULAR | Status: DC | PRN
  Administered 2015-01-03: 50 ug via INTRAVENOUS

## 2015-01-03 MED ORDER — MICROFIBRILLAR COLL HEMOSTAT EX POWD
CUTANEOUS | Status: AC
  Filled 2015-01-03: qty 5

## 2015-01-03 MED ORDER — LIDOCAINE-EPINEPHRINE (PF) 1 %-1:200000 IJ SOLN
INTRAMUSCULAR | Status: DC | PRN
  Administered 2015-01-03: 30 mL

## 2015-01-03 MED ORDER — ROCURONIUM BROMIDE 50 MG/5ML IV SOLN
INTRAVENOUS | Status: AC
  Filled 2015-01-03: qty 1

## 2015-01-03 MED ORDER — FENTANYL CITRATE (PF) 100 MCG/2ML IJ SOLN: 25.0000 ug | INTRAMUSCULAR | Status: DC | PRN

## 2015-01-03 MED ORDER — LIDOCAINE HCL (CARDIAC) 20 MG/ML IV SOLN
INTRAVENOUS | Status: AC
  Filled 2015-01-03: qty 5

## 2015-01-03 MED ORDER — LIDOCAINE-EPINEPHRINE (PF) 1 %-1:200000 IJ SOLN
INTRAMUSCULAR | Status: AC
  Filled 2015-01-03: qty 30

## 2015-01-03 MED ORDER — CEFAZOLIN SODIUM-DEXTROSE 2-3 GM-% IV SOLR
INTRAVENOUS | Status: DC | PRN
  Administered 2015-01-03: 2 g via INTRAVENOUS

## 2015-01-03 MED ORDER — METOPROLOL TARTRATE 100 MG PO TABS
100.0000 mg | ORAL_TABLET | Freq: Once | ORAL | Status: AC
  Administered 2015-01-03: 100 mg via ORAL
  Filled 2015-01-03: qty 1

## 2015-01-03 MED ORDER — PROPOFOL 10 MG/ML IV BOLUS
INTRAVENOUS | Status: AC
  Filled 2015-01-03: qty 20

## 2015-01-03 MED ORDER — METOPROLOL TARTRATE 50 MG PO TABS
ORAL_TABLET | ORAL | Status: AC
  Filled 2015-01-03: qty 2

## 2015-01-03 MED ORDER — SODIUM CHLORIDE 0.9 % IV SOLN
INTRAVENOUS | Status: DC | PRN
  Administered 2015-01-03: 12:00:00

## 2015-01-03 MED ORDER — OXYCODONE HCL 5 MG PO TABS
5.0000 mg | ORAL_TABLET | Freq: Once | ORAL | Status: AC
  Administered 2015-01-03: 5 mg via ORAL

## 2015-01-03 MED ORDER — ONDANSETRON HCL 4 MG/2ML IJ SOLN
INTRAMUSCULAR | Status: AC
  Filled 2015-01-03: qty 2

## 2015-01-03 MED ORDER — MICROFIBRILLAR COLL HEMOSTAT EX POWD
CUTANEOUS | Status: DC | PRN
  Administered 2015-01-03: 5 g via TOPICAL

## 2015-01-03 MED ORDER — CHLORHEXIDINE GLUCONATE CLOTH 2 % EX PADS: 6.0000 | MEDICATED_PAD | Freq: Once | CUTANEOUS | Status: DC

## 2015-01-03 MED ORDER — PROPOFOL 500 MG/50ML IV EMUL
INTRAVENOUS | Status: DC | PRN
  Administered 2015-01-03: 50 ug/kg/min via INTRAVENOUS

## 2015-01-03 MED ORDER — MIDAZOLAM HCL 2 MG/2ML IJ SOLN: 0.5000 mg | Freq: Once | INTRAMUSCULAR | Status: DC | PRN

## 2015-01-03 MED ORDER — OXYCODONE HCL 5 MG PO TABS: 5.0000 mg | ORAL_TABLET | ORAL | Status: DC | PRN

## 2015-01-03 MED ORDER — MEPERIDINE HCL 25 MG/ML IJ SOLN: 6.2500 mg | INTRAMUSCULAR | Status: DC | PRN

## 2015-01-03 MED ORDER — PROMETHAZINE HCL 25 MG/ML IJ SOLN: 6.2500 mg | INTRAMUSCULAR | Status: DC | PRN

## 2015-01-03 MED ORDER — SODIUM CHLORIDE 0.9 % IV SOLN
INTRAVENOUS | Status: DC | PRN
  Administered 2015-01-03: 12:00:00 via INTRAVENOUS

## 2015-01-03 MED ORDER — EPHEDRINE SULFATE 50 MG/ML IJ SOLN
INTRAMUSCULAR | Status: AC
  Filled 2015-01-03: qty 1

## 2015-01-03 MED ORDER — SODIUM CHLORIDE 0.9 % IJ SOLN
INTRAMUSCULAR | Status: AC
  Filled 2015-01-03: qty 10

## 2015-01-03 MED ORDER — 0.9 % SODIUM CHLORIDE (POUR BTL) OPTIME
TOPICAL | Status: DC | PRN
  Administered 2015-01-03: 1000 mL

## 2015-01-03 MED ORDER — FENTANYL CITRATE (PF) 250 MCG/5ML IJ SOLN
INTRAMUSCULAR | Status: AC
  Filled 2015-01-03: qty 5

## 2015-01-03 MED ORDER — CHLORHEXIDINE GLUCONATE CLOTH 2 % EX PADS
6.0000 | MEDICATED_PAD | Freq: Once | CUTANEOUS | Status: DC
Start: 2015-01-03 — End: 2015-01-03

## 2015-01-03 SURGICAL SUPPLY — 32 items
ARMBAND PINK RESTRICT EXTREMIT (MISCELLANEOUS) ×2 IMPLANT
CANISTER SUCTION 2500CC (MISCELLANEOUS) ×2 IMPLANT
CLIP TI MEDIUM 6 (CLIP) ×2 IMPLANT
CLIP TI WIDE RED SMALL 6 (CLIP) ×2 IMPLANT
COVER PROBE W GEL 5X96 (DRAPES) ×2 IMPLANT
ELECT REM PT RETURN 9FT ADLT (ELECTROSURGICAL) ×2
ELECTRODE REM PT RTRN 9FT ADLT (ELECTROSURGICAL) ×1 IMPLANT
GLOVE BIOGEL PI IND STRL 6.5 (GLOVE) ×2 IMPLANT
GLOVE BIOGEL PI IND STRL 7.0 (GLOVE) ×1 IMPLANT
GLOVE BIOGEL PI IND STRL 7.5 (GLOVE) ×1 IMPLANT
GLOVE BIOGEL PI INDICATOR 6.5 (GLOVE) ×2
GLOVE BIOGEL PI INDICATOR 7.0 (GLOVE) ×1
GLOVE BIOGEL PI INDICATOR 7.5 (GLOVE) ×1
GLOVE ECLIPSE 6.5 STRL STRAW (GLOVE) ×4 IMPLANT
GLOVE SURG SS PI 7.5 STRL IVOR (GLOVE) ×2 IMPLANT
GOWN STRL REUS W/ TWL LRG LVL3 (GOWN DISPOSABLE) ×2 IMPLANT
GOWN STRL REUS W/ TWL XL LVL3 (GOWN DISPOSABLE) ×1 IMPLANT
GOWN STRL REUS W/TWL LRG LVL3 (GOWN DISPOSABLE) ×2
GOWN STRL REUS W/TWL XL LVL3 (GOWN DISPOSABLE) ×1
HEMOSTAT SNOW SURGICEL 2X4 (HEMOSTASIS) IMPLANT
KIT BASIN OR (CUSTOM PROCEDURE TRAY) ×2 IMPLANT
KIT ROOM TURNOVER OR (KITS) ×2 IMPLANT
LIQUID BAND (GAUZE/BANDAGES/DRESSINGS) ×2 IMPLANT
NS IRRIG 1000ML POUR BTL (IV SOLUTION) ×2 IMPLANT
PACK CV ACCESS (CUSTOM PROCEDURE TRAY) ×2 IMPLANT
PAD ARMBOARD 7.5X6 YLW CONV (MISCELLANEOUS) ×4 IMPLANT
SUT PROLENE 6 0 CC (SUTURE) ×4 IMPLANT
SUT VIC AB 3-0 SH 27 (SUTURE) ×1
SUT VIC AB 3-0 SH 27X BRD (SUTURE) ×1 IMPLANT
SUT VICRYL 4-0 PS2 18IN ABS (SUTURE) IMPLANT
UNDERPAD 30X30 INCONTINENT (UNDERPADS AND DIAPERS) ×2 IMPLANT
WATER STERILE IRR 1000ML POUR (IV SOLUTION) ×2 IMPLANT

## 2015-01-03 NOTE — OR Nursing (Signed)
[facility]. Pt to resume Eliquis 11/12

## 2015-01-03 NOTE — Op Note (Signed)
    Patient name: Derrick ShipperJames H Quilter MRN: [id number] DOB: [birth year] Sex: male  01/03/2015 Pre-operative Diagnosis: CKD Post-operative diagnosis:  Same Surgeon:  Durene CalBrabham, Wells Procedure:   Right first stage basilic vein transposition Anesthesia:  Mac Blood Loss:  See anesthesia record Specimens:  None  Findings:  I utilized a branch of the basilic vein.  The basilic vein was excellent caliber as was the artery.  Indications:  The patient is not yet on dialysis.  He has had a stroke which precluded using his left arm because it has a chronic contracture.  He had vein mapping which showed an adequate basilic vein.  He comes in today for a first stage basilic vein transposition versus a brachiocephalic fistula  Procedure:  The patient was identified in the holding area and [facility]  The patient was then placed supine on the table. MAC anesthesia was administered.  The patient was prepped and draped in the usual sterile fashion.  A time out was called and antibiotics were administered.  One percent lidocaine was used for local anesthesia.  I first evaluated the cephalic vein.  I did not feel that it was adequate.  His basilic vein was of excellent diameter, greater than 5 mm.  I made a oblique incision at the antecubital crease.  Through this incision and dissected out the basilic vein.  There was a large medial branch about 3 mm which I circumferentially dissected free from the main basilic vein out to the medial extent of the incision.  This was marked with an ink pen for orientation.  I then dissected out the brachial artery.  This was a 3 mm artery without calcification.  Once this was adequately exposed, the patient was given 3000 units of heparin.  I then occluded the vein branch distally and ligated with a 2-0 silk tie.  There was good backbleeding from the vein.  It flushed easily with heparinized saline.  It was occluded with a bulldog clamp.  I then occluded the brachial artery with  Serafin clamps.  A #11 blade was used to make an arteriotomy which was extended longitudinally with Potts scissors.  The vein was cut the appropriate length and then spatulated.  A running end-to-side anastomosis was created with 6-0 Prolene.  Prior to completion, the appropriate flushing maneuvers were performed the anastomosis was completed.  There remained a palpable radial pulse.  There was a palpable thrill and audible thrill within the fistula.  25 mg protamine was given.  Avitene was utilized.  The incision was closed with 2 layers of 3-0 Vicryl followed by Dermabond.   Disposition:  To PACU in stable condition.   Juleen ChinaV. Wells Brabham, M.D. [facility] [place] Office: [phone] Pager:  [phone]

## 2015-01-03 NOTE — Anesthesia Postprocedure Evaluation (Signed)
  Anesthesia Post-op Note  Patient: Derrick Howell  Procedure(s) Performed: Procedure(s): ARTERIOVENOUS (AV) FISTULA CREATION (Right)  Patient Location: PACU  Anesthesia Type:MAC  Level of Consciousness: awake, alert , oriented and patient cooperative  Airway and Oxygen Therapy: Patient Spontanous Breathing and Patient connected to nasal cannula oxygen  Post-op Pain: mild  Post-op Assessment: Post-op Vital signs reviewed, Patient's Cardiovascular Status Stable, Respiratory Function Stable, Patent Airway, No signs of Nausea or vomiting and Pain level controlled              Post-op Vital Signs: Reviewed and stable  Last Vitals:  Filed Vitals:   01/03/15 1256  BP: 182/79  Pulse: 71  Temp: 36.9 C  Resp: 14    Complications: No apparent anesthesia complications

## 2015-01-03 NOTE — Anesthesia Procedure Notes (Signed)
Procedure Name: MAC Date/Time: 01/03/2015 11:30 AM Performed by: Coralee RudFLORES, Torianna Junio Oxygen Delivery Method: Simple face mask Placement Confirmation: positive ETCO2

## 2015-01-03 NOTE — Interval H&P Note (Signed)
History and Physical Interval Note:  01/03/2015 10:45 AM  Derrick Howell  has presented today for surgery, with the diagnosis of Stage IV Chronic Kidney Disease N18.4  The various methods of treatment have been discussed with the patient and family. After consideration of risks, benefits and other options for treatment, the patient has consented to  Procedure(s): ARTERIOVENOUS (AV) FISTULA CREATION (Right) as a surgical intervention .  The patient's history has been reviewed, patient examined, no change in status, stable for surgery.  I have reviewed the patient's chart and labs.  Questions were answered to the patient's satisfaction.     Durene CalBrabham, Wells

## 2015-01-03 NOTE — Transfer of Care (Signed)
Immediate Anesthesia Transfer of Care Note  Patient: Derrick ShipperJames H Howell  Procedure(s) Performed: Procedure(s): ARTERIOVENOUS (AV) FISTULA CREATION (Right)  Patient Location: PACU  Anesthesia Type:MAC  Level of Consciousness: awake, alert , oriented and patient cooperative  Airway & Oxygen Therapy: Patient Spontanous Breathing and Patient connected to face mask oxygen  Post-op Assessment: Report given to RN, Post -op Vital signs reviewed and stable and Patient moving all extremities  Post vital signs: Reviewed and stable  Last Vitals:  Filed Vitals:   01/03/15 0832  BP: 205/83  Pulse: 83  Temp: 36.7 C  Resp: 16    Complications: No apparent anesthesia complications

## 2015-01-03 NOTE — H&P (View-Only) (Signed)
Patient name: Derrick Howell MRN: [id number] DOB: 1945-04-06 Sex: male   Referred by: Renal  Reason for referral:  Chief Complaint  Patient presents with  . New Evaluation    Stage 4 CKD;referred by Dr. Georg Ruddle for perm. access placement    HISTORY OF PRESENT ILLNESS: This is a 69 year old gentleman with stage IV renal insufficiency he comes in today for consideration of dialysis access.  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  He lives in a nursing home.  Patient has a history of uncontrolled hypertension.  He also suffers from diabetes.  He has atrial fibrillation and takes Eliquis.  Past Medical History  Diagnosis Date  . Hyperlipidemia   . Hypertension   . COPD (chronic obstructive pulmonary disease) (HCC)   . Stroke (HCC)   . Atrial fibrillation (HCC)   . Depression   . OSA (obstructive sleep apnea)   . Diabetes mellitus without complication (HCC)   . CKD (chronic kidney disease)     No past surgical history on file.  Social History   Social History  . Marital Status: Married    Spouse Name: N/A  . Number of Children: N/A  . Years of Education: N/A   Occupational History  . Not on file.   Social History Main Topics  . Smoking status: Never Smoker   . Smokeless tobacco: Not on file  . Alcohol Use: No  . Drug Use: No  . Sexual Activity: Not on file   Other Topics Concern  . Not on file   Social History Narrative    No family history on file.  Allergies as of 12/23/2014  . (No Known Allergies)    Current Outpatient Prescriptions on File Prior to Visit  Medication Sig Dispense Refill  . diltiazem (CARDIZEM) 120 MG tablet Take 240 mg by mouth daily.     . fenofibrate (TRICOR) 145 MG tablet Take 145 mg by mouth daily.    . hydrALAZINE (APRESOLINE) 50 MG tablet Take 100 mg by mouth 4 (four) times daily.     . magnesium hydroxide (MILK OF MAGNESIA) 400 MG/5ML suspension Take 30 mLs by  mouth at bedtime as needed for mild constipation.    . metoprolol (LOPRESSOR) 50 MG tablet Take 100 mg by mouth 2 (two) times daily.     . ondansetron (ZOFRAN ODT) 8 MG disintegrating tablet Take 1 tablet (8 mg total) by mouth every 8 (eight) hours as needed for nausea or vomiting. 10 tablet 0  . pramipexole (MIRAPEX) 0.5 MG tablet Take 0.5 mg by mouth every evening.    Marland Kitchen acetaminophen (TYLENOL) 500 MG tablet Take 500 mg by mouth every 4 (four) hours as needed.    Marland Kitchen alum & mag hydroxide-simeth (MAALOX/MYLANTA) 200-200-20 MG/5ML suspension Take by mouth every 6 (six) hours as needed for indigestion or heartburn.    . cloNIDine (CATAPRES - DOSED IN MG/24 HR) 0.2 mg/24hr patch Place 0.2 mg onto the skin once a week.    . escitalopram (LEXAPRO) 5 MG tablet Take 5 mg by mouth daily.    Marland Kitchen guaiFENesin (ROBITUSSIN) 100 MG/5ML liquid Take 200 mg by mouth 4 (four) times daily as needed for cough.    . hydrochlorothiazide (HYDRODIURIL) 25 MG tablet Take 25 mg by mouth daily.    Marland Kitchen lisinopril (PRINIVIL,ZESTRIL) 40 MG tablet Take 40 mg by mouth daily.    . ranitidine (ZANTAC) 150 MG capsule Take 150  mg by mouth daily.    . rivaroxaban (XARELTO) 10 MG TABS tablet Take 20 mg by mouth daily.    . simvastatin (ZOCOR) 20 MG tablet Take 20 mg by mouth daily.    . traMADol (ULTRAM) 50 MG tablet Take by mouth 3 (three) times daily.     No current facility-administered medications on file prior to visit.     REVIEW OF SYSTEMS: Cardiovascular: No chest pain, chest pressure, palpitations, orthopnea, or dyspnea on exertion. No claudication or rest pain,  No history of DVT or phlebitis. Pulmonary: No productive cough, asthma or wheezing. Neurologic: Left-sided hemiparesis Hematologic: No bleeding problems or clotting disorders. Musculoskeletal: No joint pain or joint swelling. Gastrointestinal: No blood in stool or hematemesis Genitourinary: No dysuria or hematuria. Psychiatric:: No history of major  depression. Integumentary: No rashes or ulcers. Constitutional: No fever or chills.  PHYSICAL EXAMINATION:  Filed Vitals:   12/23/14 1331 12/23/14 1333  BP: 172/70 160/80  Pulse: 76   Temp: 97.8 F (36.6 C)   TempSrc: Oral   Resp: 16   Height: 5\' 6"  (1.676 m)   Weight: 217 lb (98.431 kg)    Body mass index is 35.04 kg/(m^2). General: The patient appears their stated age.   HEENT:  No gross abnormalities Pulmonary: Respirations are non-labored Musculoskeletal: There are no major deformities.   Neurologic: No focal weakness or paresthesias are detected, Skin: There are no ulcer or rashes noted. Psychiatric: The patient has normal affect. Cardiovascular: There is a regular rate and rhythm without significant murmur appreciated.  Palpable right brachial and radial pulse  Diagnostic Studies: I have reviewed his ultrasound studies.  He has biphasic radial artery and brachial artery waveforms. Vein mapping could not be performed of his left arm because of his contracture.  His cephalic vein on the right measures between 0.2 and 0.3 cm.  The basilic vein ranges in diameter from 0.45-0.84 cm.    Assessment:  Stage IV renal disease Plan: I discussed proceeding with a right arm fistula.  He is not a candidate for a left arm procedure given his contracture.  Most likely, this will be a first stage right basilic vein transposition, however I will evaluate his cephalic vein in the operating room and potentially perform brachiocephalic fistula if the vein is adequate.  If not he will need a 2 stage basilic vein procedure.  The risks and benefits of the procedure were discussed with the patient including the risk of non-maturity and the risk of steal syndrome.  I have scheduled his procedure for Friday, November 11.  I will stop his anticoagulation appropriately.     Jorge NyV. Wells Lashanta Elbe IV, M.D. [facility] [place] Office: [phone] Pager:  [phone]

## 2015-01-03 NOTE — Anesthesia Preprocedure Evaluation (Addendum)
Anesthesia Evaluation  Patient identified by MRN, date of birth, ID band Patient awake    Reviewed: Allergy & Precautions, NPO status , Patient's Chart, lab work & pertinent test results  History of Anesthesia Complications Negative for: history of anesthetic complications  Airway Mallampati: II  TM Distance: >3 FB Neck ROM: Full    Dental  (+) Poor Dentition, Missing, Chipped, Dental Advisory Given   Pulmonary sleep apnea , COPD, Current Smoker,    breath sounds clear to auscultation       Cardiovascular hypertension, Pt. on medications and Pt. on home beta blockers (-) angina+ dysrhythmias Atrial Fibrillation  Rhythm:Irregular Rate:Normal     Neuro/Psych CVA (L weakness, walks with walker), Residual Symptoms    GI/Hepatic negative GI ROS, Neg liver ROS,   Endo/Other  diabetes (glu 150), Insulin DependentMorbid obesity  Renal/GU ESRF and Renal InsufficiencyRenal disease (K+ 3.9, no dialysis yet)     Musculoskeletal   Abdominal (+) + obese,   Peds  Hematology  (+) Blood dyscrasia (eliquis), ,   Anesthesia Other Findings   Reproductive/Obstetrics                           Anesthesia Physical Anesthesia Plan  ASA: III  Anesthesia Plan: MAC   Post-op Pain Management:    Induction:   Airway Management Planned: Natural Airway and Simple Face Mask  Additional Equipment:   Intra-op Plan:   Post-operative Plan:   Informed Consent: I have reviewed the patients History and Physical, chart, labs and discussed the procedure including the risks, benefits and alternatives for the proposed anesthesia with the patient or authorized representative who has indicated his/her understanding and acceptance.   Dental advisory given  Plan Discussed with: Surgeon and CRNA  Anesthesia Plan Comments: (Plan routine monitors, MAC)        Anesthesia Quick Evaluation

## 2015-01-06 ENCOUNTER — Encounter (HOSPITAL_COMMUNITY): Payer: Self-pay | Admitting: Surgery

## 2015-01-07 ENCOUNTER — Telehealth: Payer: Self-pay | Admitting: Surgery

## 2015-01-07 NOTE — Telephone Encounter (Signed)
-----   Message from Sharee PimpleMarilyn K McChesney, RN sent at 01/03/2015  3:19 PM EST ----- Regarding: schedule   ----- Message -----    From: Nada LibmanVance W Brabham, MD    Sent: 01/03/2015  12:51 PM      To: [facility] Charge Pool  01/03/2015:  Surgeon:  Durene CalBrabham, Wells Procedure:   Right first stage basilic vein transposition  Follow-up 6 weeks with duplex of fistula

## 2015-01-07 NOTE — Telephone Encounter (Signed)
Notified debra at [facility], dpm

## 2015-02-20 ENCOUNTER — Encounter: Payer: Self-pay | Admitting: Surgery

## 2015-02-28 ENCOUNTER — Other Ambulatory Visit: Payer: Self-pay | Admitting: Surgery

## 2015-02-28 DIAGNOSIS — N184 Chronic kidney disease, stage 4 (severe): Secondary | ICD-10-CM

## 2015-02-28 DIAGNOSIS — Z4931 Encounter for adequacy testing for hemodialysis: Secondary | ICD-10-CM

## 2015-03-03 ENCOUNTER — Encounter (HOSPITAL_COMMUNITY): Payer: Medicare Other

## 2015-03-03 ENCOUNTER — Ambulatory Visit (INDEPENDENT_AMBULATORY_CARE_PROVIDER_SITE_OTHER): Payer: Medicare Other | Admitting: Surgery

## 2015-03-03 ENCOUNTER — Encounter: Payer: Self-pay | Admitting: Surgery

## 2015-03-03 ENCOUNTER — Encounter: Payer: Medicare Other | Admitting: Surgery

## 2015-03-03 ENCOUNTER — Ambulatory Visit (HOSPITAL_COMMUNITY)
Admission: RE | Admit: 2015-03-03 | Discharge: 2015-03-03 | Disposition: A | Discharge status: Home / Self Care | Payer: Medicare Other | Source: Ambulatory Visit | Attending: Surgery | Admitting: Surgery

## 2015-03-03 VITALS — BP 140/63 | HR 63 | Temp 98.3°F | Ht 66.0 in | Wt 217.0 lb

## 2015-03-03 DIAGNOSIS — I129 Hypertensive chronic kidney disease with stage 1 through stage 4 chronic kidney disease, or unspecified chronic kidney disease: Secondary | ICD-10-CM | POA: Insufficient documentation

## 2015-03-03 DIAGNOSIS — Z4931 Encounter for adequacy testing for hemodialysis: Secondary | ICD-10-CM | POA: Diagnosis not present

## 2015-03-03 DIAGNOSIS — E1122 Type 2 diabetes mellitus with diabetic chronic kidney disease: Secondary | ICD-10-CM | POA: Diagnosis not present

## 2015-03-03 DIAGNOSIS — E785 Hyperlipidemia, unspecified: Secondary | ICD-10-CM | POA: Insufficient documentation

## 2015-03-03 DIAGNOSIS — N184 Chronic kidney disease, stage 4 (severe): Secondary | ICD-10-CM | POA: Diagnosis not present

## 2015-03-03 NOTE — Progress Notes (Signed)
Patient name: Derrick Howell MRN: [id number] DOB: 09/02/1945 Sex: male     Chief Complaint  Patient presents with  . Re-evaluation    6 wk f/u     HISTORY OF PRESENT ILLNESS:  The patient is back for follow-up. He is status post right first stage basilic vein transposition in 12/2014.  He reports no symptoms of steal.  He is not yet on dialysis.  Past Medical History  Diagnosis Date  . Hyperlipidemia   . Hypertension   . COPD (chronic obstructive pulmonary disease) (HCC)   . Stroke (HCC)   . Atrial fibrillation (HCC)   . Depression   . OSA (obstructive sleep apnea)   . Diabetes mellitus without complication (HCC)   . CKD (chronic kidney disease)   . Peripheral vascular disease (HCC)     Left side    Past Surgical History  Procedure Laterality Date  . Av fistula placement Right 01/03/2015    Procedure: ARTERIOVENOUS (AV) FISTULA CREATION;  Surgeon: Nada Libman, MD;  Location: [facility] OR;  Service: Vascular;  Laterality: Right;    Social History   Social History  . Marital Status: Married    Spouse Name: N/A  . Number of Children: N/A  . Years of Education: N/A   Occupational History  . Not on file.   Social History Main Topics  . Smoking status: Never Smoker   . Smokeless tobacco: Not on file  . Alcohol Use: No  . Drug Use: No  . Sexual Activity: Not on file   Other Topics Concern  . Not on file   Social History Narrative    No family history on file.  Allergies as of 03/03/2015  . (No Known Allergies)    Current Outpatient Prescriptions on File Prior to Visit  Medication Sig Dispense Refill  . acetaminophen (TYLENOL) 500 MG tablet Take 500 mg by mouth every 4 (four) hours as needed.    Marland Kitchen alum & mag hydroxide-simeth (MAALOX/MYLANTA) 200-200-20 MG/5ML suspension Take by mouth every 6 (six) hours as needed for indigestion or heartburn.    Marland Kitchen amLODipine (NORVASC) 10 MG tablet Take 10 mg by mouth daily.    Marland Kitchen apixaban (ELIQUIS) 2.5 MG TABS tablet Take  2.5 mg by mouth 2 (two) times daily.    . bumetanide (BUMEX) 1 MG tablet Take 1 mg by mouth 2 (two) times daily.    . calcitRIOL (ROCALTROL) 0.25 MCG capsule Take 0.25 mcg by mouth daily.    . Cholecalciferol (VITAMIN D3) 2000 UNITS TABS Take 1 tablet by mouth daily.    . cloNIDine (CATAPRES) 0.2 MG tablet Take 0.2 mg by mouth every 6 (six) hours as needed.     . diltiazem (DILACOR XR) 240 MG 24 hr capsule Take 240 mg by mouth daily.    Marland Kitchen docusate sodium (COLACE) 100 MG capsule Take 100 mg by mouth 2 (two) times daily.    . ferrous sulfate 325 (65 FE) MG tablet Take 325 mg by mouth 3 (three) times daily with meals.     Marland Kitchen guaiFENesin (ROBITUSSIN) 100 MG/5ML liquid Take 200 mg by mouth 4 (four) times daily as needed for cough.    . hydrALAZINE (APRESOLINE) 50 MG tablet Take 100 mg by mouth 4 (four) times daily.     . insulin aspart (NOVOLOG) 100 UNIT/ML injection Inject 8 Units into the skin 3 (three) times daily after meals.    . insulin glargine (LANTUS) 100 UNIT/ML injection Inject 20 Units into  the skin at bedtime.    Marland Kitchen. loperamide (IMODIUM) 2 MG capsule Take 2 mg by mouth as needed for diarrhea or loose stools.    . magnesium hydroxide (MILK OF MAGNESIA) 400 MG/5ML suspension Take 30 mLs by mouth at bedtime as needed for mild constipation.    . Melatonin 5 MG TABS Take 1 tablet by mouth at bedtime.    . metoprolol (LOPRESSOR) 100 MG tablet Take 100 mg by mouth 2 (two) times daily.    . mirtazapine (REMERON) 15 MG tablet Take 15 mg by mouth at bedtime.    . ondansetron (ZOFRAN ODT) 8 MG disintegrating tablet Take 1 tablet (8 mg total) by mouth every 8 (eight) hours as needed for nausea or vomiting. 10 tablet 0  . oxyCODONE (ROXICODONE) 5 MG immediate release tablet Take 1 tablet (5 mg total) by mouth every 4 (four) hours as needed for severe pain. 30 tablet 0  . potassium chloride SA (K-DUR,KLOR-CON) 20 MEQ tablet Take 20 mEq by mouth daily.    . pramipexole (MIRAPEX) 0.5 MG tablet Take 0.5 mg  by mouth every evening.    . sodium bicarbonate 650 MG tablet Take 650 mg by mouth 2 (two) times daily.    . vitamin C (ASCORBIC ACID) 500 MG tablet Take 500 mg by mouth daily.     No current facility-administered medications on file prior to visit.     REVIEW OF SYSTEMS:  no changes from prior visit   PHYSICAL EXAMINATION:   Vital signs are  Filed Vitals:   03/03/15 1458  BP: 140/63  Pulse: 63  Temp: 98.3 F (36.8 C)  TempSrc: Oral  Height: 5\' 6"  (1.676 m)  Weight: 217 lb (98.431 kg)  SpO2: 97%   Body mass index is 35.04 kg/(m^2). General: The patient appears their stated age. HEENT:  No gross abnormalities Pulmonary:  Non labored breathing  Palpable thrill within right basilic vein fistula   Diagnostic Studies  I have reviewed his vein mapping and fistula ultrasound which shows that the vein is 0 point 8-1 0.0 centimeters in diameter throughout most of the length, however it is small around 0 point 3 4-0 0.45 near the arterial venous anastomosis.  Assessment:  chronic renal insufficiency Plan:  I discussed proceeding with a second stage basilic vein transposition.  This is been scheduled for Thursday, January 19.  He will need to stop his Eliquis.   I will evaluate the vein in the operating room.  The vein near the arteriovenous anastomosis , by ultrasound has narrowed down. This may need to be resected.  I will make that determination in the operating room  V. Charlena CrossWells Anthonyjames Bargar IV, M.D. [facility] [place] Office: [phone] Pager:  [phone]

## 2015-03-05 ENCOUNTER — Other Ambulatory Visit: Payer: Self-pay

## 2015-03-13 MED ORDER — DEXTROSE 5 % IV SOLN
1.5000 g | INTRAVENOUS | Status: AC
  Administered 2015-03-14: 1.5 g via INTRAVENOUS
  Filled 2015-03-13: qty 1.5

## 2015-03-13 MED ORDER — SODIUM CHLORIDE 0.9 % IV SOLN: INTRAVENOUS | Status: DC

## 2015-03-13 NOTE — Progress Notes (Addendum)
I spoke with Herbert Seta, patient care coordinator at [facility] and gave instructions for patients surgery in am.  Herbert Seta reported that patient has been a patient in ED in [facility] in the past 2 months.  Patient was sent to ED for fluid over load and was given IV Lasix.  I faxed a request to [facility] requesting records.  I gave Herbert Seta instructions to give only 20 Units of Lantus tonight and to check CBG in am if <70 to give Glucose Gel to recheck in 15 minutes and to call Pre- OP for instructions.  I also asked her to call PAT if CBG > 220.

## 2015-03-14 ENCOUNTER — Encounter (HOSPITAL_COMMUNITY)
Admission: RE | Disposition: A | Discharge status: Home / Self Care | Payer: Self-pay | Source: Ambulatory Visit | Attending: Surgery

## 2015-03-14 ENCOUNTER — Ambulatory Visit (HOSPITAL_COMMUNITY)
Admission: RE | Admit: 2015-03-14 | Discharge: 2015-03-14 | Disposition: A | Discharge status: Home / Self Care | Payer: Medicare Other | Source: Ambulatory Visit | Attending: Surgery | Admitting: Surgery

## 2015-03-14 ENCOUNTER — Ambulatory Visit (HOSPITAL_COMMUNITY): Payer: Medicare Other | Admitting: Certified Registered"

## 2015-03-14 ENCOUNTER — Encounter (HOSPITAL_COMMUNITY): Payer: Self-pay | Admitting: Certified Registered"

## 2015-03-14 DIAGNOSIS — Z6835 Body mass index (BMI) 35.0-35.9, adult: Secondary | ICD-10-CM | POA: Diagnosis not present

## 2015-03-14 DIAGNOSIS — E1151 Type 2 diabetes mellitus with diabetic peripheral angiopathy without gangrene: Secondary | ICD-10-CM | POA: Diagnosis not present

## 2015-03-14 DIAGNOSIS — G4733 Obstructive sleep apnea (adult) (pediatric): Secondary | ICD-10-CM | POA: Insufficient documentation

## 2015-03-14 DIAGNOSIS — F329 Major depressive disorder, single episode, unspecified: Secondary | ICD-10-CM | POA: Diagnosis not present

## 2015-03-14 DIAGNOSIS — J449 Chronic obstructive pulmonary disease, unspecified: Secondary | ICD-10-CM | POA: Insufficient documentation

## 2015-03-14 DIAGNOSIS — Z79899 Other long term (current) drug therapy: Secondary | ICD-10-CM | POA: Insufficient documentation

## 2015-03-14 DIAGNOSIS — N185 Chronic kidney disease, stage 5: Secondary | ICD-10-CM | POA: Diagnosis not present

## 2015-03-14 DIAGNOSIS — Z794 Long term (current) use of insulin: Secondary | ICD-10-CM | POA: Insufficient documentation

## 2015-03-14 DIAGNOSIS — Z8673 Personal history of transient ischemic attack (TIA), and cerebral infarction without residual deficits: Secondary | ICD-10-CM | POA: Insufficient documentation

## 2015-03-14 DIAGNOSIS — I4891 Unspecified atrial fibrillation: Secondary | ICD-10-CM | POA: Insufficient documentation

## 2015-03-14 DIAGNOSIS — N186 End stage renal disease: Secondary | ICD-10-CM | POA: Insufficient documentation

## 2015-03-14 DIAGNOSIS — I120 Hypertensive chronic kidney disease with stage 5 chronic kidney disease or end stage renal disease: Secondary | ICD-10-CM | POA: Insufficient documentation

## 2015-03-14 DIAGNOSIS — E1122 Type 2 diabetes mellitus with diabetic chronic kidney disease: Secondary | ICD-10-CM | POA: Insufficient documentation

## 2015-03-14 DIAGNOSIS — Z7901 Long term (current) use of anticoagulants: Secondary | ICD-10-CM | POA: Diagnosis not present

## 2015-03-14 DIAGNOSIS — E785 Hyperlipidemia, unspecified: Secondary | ICD-10-CM | POA: Insufficient documentation

## 2015-03-14 HISTORY — PX: BASCILIC VEIN TRANSPOSITION: SHX5742

## 2015-03-14 LAB — PROTIME-INR
INR: 1.14 (ref 0.00–1.49)
PROTHROMBIN TIME: 14.8 s (ref 11.6–15.2)

## 2015-03-14 LAB — POCT I-STAT 4, (NA,K, GLUC, HGB,HCT)
Glucose, Bld: 152 mg/dL — ABNORMAL HIGH (ref 65–99)
HEMATOCRIT: 31 % — AB (ref 39.0–52.0)
HEMOGLOBIN: 10.5 g/dL — AB (ref 13.0–17.0)
Potassium: 4.1 mmol/L (ref 3.5–5.1)
SODIUM: 142 mmol/L (ref 135–145)

## 2015-03-14 LAB — GLUCOSE, CAPILLARY: Glucose-Capillary: 118 mg/dL — ABNORMAL HIGH (ref 65–99)

## 2015-03-14 SURGERY — TRANSPOSITION, VEIN, BASILIC
Anesthesia: General | Site: Arm Upper | Laterality: Right

## 2015-03-14 MED ORDER — SODIUM CHLORIDE 0.9 % IV SOLN
INTRAVENOUS | Status: DC
  Administered 2015-03-14 (×2): via INTRAVENOUS

## 2015-03-14 MED ORDER — CHLORHEXIDINE GLUCONATE CLOTH 2 % EX PADS
6.0000 | MEDICATED_PAD | Freq: Once | CUTANEOUS | Status: DC
Start: 2015-03-14 — End: 2015-03-14

## 2015-03-14 MED ORDER — CHLORHEXIDINE GLUCONATE CLOTH 2 % EX PADS: 6.0000 | MEDICATED_PAD | Freq: Once | CUTANEOUS | Status: DC

## 2015-03-14 MED ORDER — ONDANSETRON HCL 4 MG/2ML IJ SOLN
INTRAMUSCULAR | Status: DC | PRN
  Administered 2015-03-14: 4 mg via INTRAVENOUS

## 2015-03-14 MED ORDER — HEPARIN SODIUM (PORCINE) 1000 UNIT/ML IJ SOLN
INTRAMUSCULAR | Status: AC
  Filled 2015-03-14: qty 1

## 2015-03-14 MED ORDER — MIDAZOLAM HCL 2 MG/2ML IJ SOLN
INTRAMUSCULAR | Status: AC
  Filled 2015-03-14: qty 2

## 2015-03-14 MED ORDER — ONDANSETRON HCL 4 MG/2ML IJ SOLN: 4.0000 mg | Freq: Once | INTRAMUSCULAR | Status: DC | PRN

## 2015-03-14 MED ORDER — OXYCODONE HCL 5 MG PO TABS: 5.0000 mg | ORAL_TABLET | ORAL | Status: AC | PRN

## 2015-03-14 MED ORDER — 0.9 % SODIUM CHLORIDE (POUR BTL) OPTIME
TOPICAL | Status: DC | PRN
  Administered 2015-03-14: 1000 mL

## 2015-03-14 MED ORDER — LIDOCAINE HCL (CARDIAC) 20 MG/ML IV SOLN
INTRAVENOUS | Status: DC | PRN
  Administered 2015-03-14: 50 mg via INTRAVENOUS

## 2015-03-14 MED ORDER — ROCURONIUM BROMIDE 50 MG/5ML IV SOLN
INTRAVENOUS | Status: AC
  Filled 2015-03-14: qty 1

## 2015-03-14 MED ORDER — APIXABAN 2.5 MG PO TABS: 2.5000 mg | ORAL_TABLET | Freq: Two times a day (BID) | ORAL | Status: AC

## 2015-03-14 MED ORDER — PROPOFOL 10 MG/ML IV BOLUS
INTRAVENOUS | Status: AC
  Filled 2015-03-14: qty 20

## 2015-03-14 MED ORDER — PROPOFOL 10 MG/ML IV BOLUS
INTRAVENOUS | Status: DC | PRN
  Administered 2015-03-14 (×2): 50 mg via INTRAVENOUS
  Administered 2015-03-14: 150 mg via INTRAVENOUS

## 2015-03-14 MED ORDER — VECURONIUM BROMIDE 10 MG IV SOLR
INTRAVENOUS | Status: AC
  Filled 2015-03-14: qty 10

## 2015-03-14 MED ORDER — LIDOCAINE-EPINEPHRINE (PF) 1 %-1:200000 IJ SOLN
INTRAMUSCULAR | Status: DC | PRN
  Administered 2015-03-14: 30 mL

## 2015-03-14 MED ORDER — STERILE WATER FOR INJECTION IJ SOLN
INTRAMUSCULAR | Status: AC
  Filled 2015-03-14: qty 10

## 2015-03-14 MED ORDER — FENTANYL CITRATE (PF) 250 MCG/5ML IJ SOLN
INTRAMUSCULAR | Status: DC | PRN
  Administered 2015-03-14: 75 ug via INTRAVENOUS
  Administered 2015-03-14 (×2): 25 ug via INTRAVENOUS

## 2015-03-14 MED ORDER — PHENYLEPHRINE HCL 10 MG/ML IJ SOLN: 10000.0000 ug | INTRAMUSCULAR | Status: DC | PRN

## 2015-03-14 MED ORDER — PHENYLEPHRINE HCL 10 MG/ML IJ SOLN
INTRAMUSCULAR | Status: DC | PRN
  Administered 2015-03-14: 40 ug via INTRAVENOUS
  Administered 2015-03-14: 80 ug via INTRAVENOUS
  Administered 2015-03-14: 40 ug via INTRAVENOUS

## 2015-03-14 MED ORDER — SODIUM CHLORIDE 0.9 % IV SOLN
INTRAVENOUS | Status: DC | PRN
  Administered 2015-03-14: 500 mL

## 2015-03-14 MED ORDER — FENTANYL CITRATE (PF) 100 MCG/2ML IJ SOLN: 25.0000 ug | INTRAMUSCULAR | Status: DC | PRN

## 2015-03-14 MED ORDER — FENTANYL CITRATE (PF) 250 MCG/5ML IJ SOLN
INTRAMUSCULAR | Status: AC
  Filled 2015-03-14: qty 5

## 2015-03-14 MED ORDER — PROTAMINE SULFATE 10 MG/ML IV SOLN
INTRAVENOUS | Status: DC | PRN
  Administered 2015-03-14 (×5): 10 mg via INTRAVENOUS

## 2015-03-14 MED ORDER — HEPARIN SODIUM (PORCINE) 1000 UNIT/ML IJ SOLN
INTRAMUSCULAR | Status: DC | PRN
  Administered 2015-03-14: 5000 [IU] via INTRAVENOUS

## 2015-03-14 MED ORDER — LIDOCAINE-EPINEPHRINE (PF) 1 %-1:200000 IJ SOLN
INTRAMUSCULAR | Status: AC
  Filled 2015-03-14: qty 30

## 2015-03-14 MED ORDER — ONDANSETRON HCL 4 MG/2ML IJ SOLN
INTRAMUSCULAR | Status: AC
  Filled 2015-03-14: qty 2

## 2015-03-14 SURGICAL SUPPLY — 38 items
CANISTER SUCTION 2500CC (MISCELLANEOUS) ×3 IMPLANT
CLIP TI MEDIUM 24 (CLIP) IMPLANT
CLIP TI MEDIUM 6 (CLIP) IMPLANT
CLIP TI WIDE RED SMALL 24 (CLIP) IMPLANT
CLIP TI WIDE RED SMALL 6 (CLIP) IMPLANT
COVER PROBE W GEL 5X96 (DRAPES) ×3 IMPLANT
ELECT REM PT RETURN 9FT ADLT (ELECTROSURGICAL) ×3
ELECTRODE REM PT RTRN 9FT ADLT (ELECTROSURGICAL) ×1 IMPLANT
GLOVE BIO SURGEON STRL SZ 6.5 (GLOVE) ×6 IMPLANT
GLOVE BIO SURGEONS STRL SZ 6.5 (GLOVE) ×3
GLOVE BIOGEL PI IND STRL 6.5 (GLOVE) ×3 IMPLANT
GLOVE BIOGEL PI IND STRL 7.0 (GLOVE) ×3 IMPLANT
GLOVE BIOGEL PI IND STRL 7.5 (GLOVE) ×1 IMPLANT
GLOVE BIOGEL PI INDICATOR 6.5 (GLOVE) ×6
GLOVE BIOGEL PI INDICATOR 7.0 (GLOVE) ×6
GLOVE BIOGEL PI INDICATOR 7.5 (GLOVE) ×2
GLOVE SKINSENSE NS SZ7.0 (GLOVE) ×4
GLOVE SKINSENSE STRL SZ7.0 (GLOVE) ×2 IMPLANT
GLOVE SURG SS PI 7.5 STRL IVOR (GLOVE) ×3 IMPLANT
GOWN STRL REUS W/ TWL LRG LVL3 (GOWN DISPOSABLE) ×5 IMPLANT
GOWN STRL REUS W/ TWL XL LVL3 (GOWN DISPOSABLE) ×1 IMPLANT
GOWN STRL REUS W/TWL LRG LVL3 (GOWN DISPOSABLE) ×10
GOWN STRL REUS W/TWL XL LVL3 (GOWN DISPOSABLE) ×2
HEMOSTAT SNOW SURGICEL 2X4 (HEMOSTASIS) IMPLANT
KIT BASIN OR (CUSTOM PROCEDURE TRAY) ×3 IMPLANT
KIT ROOM TURNOVER OR (KITS) ×3 IMPLANT
LIQUID BAND (GAUZE/BANDAGES/DRESSINGS) ×3 IMPLANT
NS IRRIG 1000ML POUR BTL (IV SOLUTION) ×3 IMPLANT
PACK CV ACCESS (CUSTOM PROCEDURE TRAY) ×3 IMPLANT
PAD ARMBOARD 7.5X6 YLW CONV (MISCELLANEOUS) ×6 IMPLANT
SUT PROLENE 5 0 C 1 24 (SUTURE) ×15 IMPLANT
SUT PROLENE 6 0 CC (SUTURE) ×3 IMPLANT
SUT SILK 2 0 SH (SUTURE) ×3 IMPLANT
SUT VIC AB 3-0 SH 27 (SUTURE) ×4
SUT VIC AB 3-0 SH 27X BRD (SUTURE) ×2 IMPLANT
SUT VICRYL 4-0 PS2 18IN ABS (SUTURE) ×6 IMPLANT
UNDERPAD 30X30 INCONTINENT (UNDERPADS AND DIAPERS) ×3 IMPLANT
WATER STERILE IRR 1000ML POUR (IV SOLUTION) ×3 IMPLANT

## 2015-03-14 NOTE — Anesthesia Postprocedure Evaluation (Signed)
Anesthesia Post Note  Patient: Derrick Howell  Procedure(s) Performed: Procedure(s) (LRB): Right Arm SECOND STAGE BASILIC VEIN TRANSPOSITION (Right)  Patient location during evaluation: PACU Anesthesia Type: MAC Level of consciousness: awake and alert Pain management: pain level controlled Vital Signs Assessment: post-procedure vital signs reviewed and stable Respiratory status: spontaneous breathing, nonlabored ventilation, respiratory function stable and patient connected to nasal cannula oxygen Cardiovascular status: blood pressure returned to baseline and stable Postop Assessment: no signs of nausea or vomiting Anesthetic complications: no    Last Vitals:  Filed Vitals:   03/14/15 0833 03/14/15 1245  BP: 165/71 164/63  Pulse: 65 61  Temp: 36.3 C   Resp:  13    Last Pain: There were no vitals filed for this visit.               Reino Kent

## 2015-03-14 NOTE — Progress Notes (Signed)
Took report for care for lunch relief from steve shaw rn

## 2015-03-14 NOTE — Anesthesia Postprocedure Evaluation (Signed)
Anesthesia Post Note  Patient: Derrick Howell  Procedure(s) Performed: Procedure(s) (LRB): Right Arm SECOND STAGE BASILIC VEIN TRANSPOSITION (Right)  Anesthesia Post Evaluation  Last Vitals:  Filed Vitals:   03/14/15 0833  BP: 165/71  Pulse: 65  Temp: 36.3 C    Last Pain: There were no vitals filed for this visit.               Nadirah Socorro

## 2015-03-14 NOTE — H&P (View-Only) (Signed)
Patient name: Derrick Howell MRN: [id number] DOB: 09/02/1945 Sex: male     Chief Complaint  Patient presents with  . Re-evaluation    6 wk f/u     HISTORY OF PRESENT ILLNESS:  The patient is back for follow-up. He is status post right first stage basilic vein transposition in 12/2014.  He reports no symptoms of steal.  He is not yet on dialysis.  Past Medical History  Diagnosis Date  . Hyperlipidemia   . Hypertension   . COPD (chronic obstructive pulmonary disease) (HCC)   . Stroke (HCC)   . Atrial fibrillation (HCC)   . Depression   . OSA (obstructive sleep apnea)   . Diabetes mellitus without complication (HCC)   . CKD (chronic kidney disease)   . Peripheral vascular disease (HCC)     Left side    Past Surgical History  Procedure Laterality Date  . Av fistula placement Right 01/03/2015    Procedure: ARTERIOVENOUS (AV) FISTULA CREATION;  Surgeon: Nada Libman, MD;  Location: [facility] OR;  Service: Vascular;  Laterality: Right;    Social History   Social History  . Marital Status: Married    Spouse Name: N/A  . Number of Children: N/A  . Years of Education: N/A   Occupational History  . Not on file.   Social History Main Topics  . Smoking status: Never Smoker   . Smokeless tobacco: Not on file  . Alcohol Use: No  . Drug Use: No  . Sexual Activity: Not on file   Other Topics Concern  . Not on file   Social History Narrative    No family history on file.  Allergies as of 03/03/2015  . (No Known Allergies)    Current Outpatient Prescriptions on File Prior to Visit  Medication Sig Dispense Refill  . acetaminophen (TYLENOL) 500 MG tablet Take 500 mg by mouth every 4 (four) hours as needed.    Marland Kitchen alum & mag hydroxide-simeth (MAALOX/MYLANTA) 200-200-20 MG/5ML suspension Take by mouth every 6 (six) hours as needed for indigestion or heartburn.    Marland Kitchen amLODipine (NORVASC) 10 MG tablet Take 10 mg by mouth daily.    Marland Kitchen apixaban (ELIQUIS) 2.5 MG TABS tablet Take  2.5 mg by mouth 2 (two) times daily.    . bumetanide (BUMEX) 1 MG tablet Take 1 mg by mouth 2 (two) times daily.    . calcitRIOL (ROCALTROL) 0.25 MCG capsule Take 0.25 mcg by mouth daily.    . Cholecalciferol (VITAMIN D3) 2000 UNITS TABS Take 1 tablet by mouth daily.    . cloNIDine (CATAPRES) 0.2 MG tablet Take 0.2 mg by mouth every 6 (six) hours as needed.     . diltiazem (DILACOR XR) 240 MG 24 hr capsule Take 240 mg by mouth daily.    Marland Kitchen docusate sodium (COLACE) 100 MG capsule Take 100 mg by mouth 2 (two) times daily.    . ferrous sulfate 325 (65 FE) MG tablet Take 325 mg by mouth 3 (three) times daily with meals.     Marland Kitchen guaiFENesin (ROBITUSSIN) 100 MG/5ML liquid Take 200 mg by mouth 4 (four) times daily as needed for cough.    . hydrALAZINE (APRESOLINE) 50 MG tablet Take 100 mg by mouth 4 (four) times daily.     . insulin aspart (NOVOLOG) 100 UNIT/ML injection Inject 8 Units into the skin 3 (three) times daily after meals.    . insulin glargine (LANTUS) 100 UNIT/ML injection Inject 20 Units into  the skin at bedtime.    Marland Kitchen. loperamide (IMODIUM) 2 MG capsule Take 2 mg by mouth as needed for diarrhea or loose stools.    . magnesium hydroxide (MILK OF MAGNESIA) 400 MG/5ML suspension Take 30 mLs by mouth at bedtime as needed for mild constipation.    . Melatonin 5 MG TABS Take 1 tablet by mouth at bedtime.    . metoprolol (LOPRESSOR) 100 MG tablet Take 100 mg by mouth 2 (two) times daily.    . mirtazapine (REMERON) 15 MG tablet Take 15 mg by mouth at bedtime.    . ondansetron (ZOFRAN ODT) 8 MG disintegrating tablet Take 1 tablet (8 mg total) by mouth every 8 (eight) hours as needed for nausea or vomiting. 10 tablet 0  . oxyCODONE (ROXICODONE) 5 MG immediate release tablet Take 1 tablet (5 mg total) by mouth every 4 (four) hours as needed for severe pain. 30 tablet 0  . potassium chloride SA (K-DUR,KLOR-CON) 20 MEQ tablet Take 20 mEq by mouth daily.    . pramipexole (MIRAPEX) 0.5 MG tablet Take 0.5 mg  by mouth every evening.    . sodium bicarbonate 650 MG tablet Take 650 mg by mouth 2 (two) times daily.    . vitamin C (ASCORBIC ACID) 500 MG tablet Take 500 mg by mouth daily.     No current facility-administered medications on file prior to visit.     REVIEW OF SYSTEMS:  no changes from prior visit   PHYSICAL EXAMINATION:   Vital signs are  Filed Vitals:   03/03/15 1458  BP: 140/63  Pulse: 63  Temp: 98.3 F (36.8 C)  TempSrc: Oral  Height: 5\' 6"  (1.676 m)  Weight: 217 lb (98.431 kg)  SpO2: 97%   Body mass index is 35.04 kg/(m^2). General: The patient appears their stated age. HEENT:  No gross abnormalities Pulmonary:  Non labored breathing  Palpable thrill within right basilic vein fistula   Diagnostic Studies  I have reviewed his vein mapping and fistula ultrasound which shows that the vein is 0 point 8-1 0.0 centimeters in diameter throughout most of the length, however it is small around 0 point 3 4-0 0.45 near the arterial venous anastomosis.  Assessment:  chronic renal insufficiency Plan:  I discussed proceeding with a second stage basilic vein transposition.  This is been scheduled for Thursday, January 19.  He will need to stop his Eliquis.   I will evaluate the vein in the operating room.  The vein near the arteriovenous anastomosis , by ultrasound has narrowed down. This may need to be resected.  I will make that determination in the operating room  V. Charlena CrossWells Brabham IV, M.D. [facility] [place] Office: [phone] Pager:  [phone]

## 2015-03-14 NOTE — Anesthesia Preprocedure Evaluation (Addendum)
Anesthesia Evaluation  Patient identified by MRN, date of birth, ID band Patient awake    Reviewed: Allergy & Precautions, H&P , NPO status , Patient's Chart, lab work & pertinent test results, reviewed documented beta blocker date and time   History of Anesthesia Complications Negative for: history of anesthetic complications  Airway Mallampati: IV  TM Distance: >3 FB Neck ROM: Full    Dental  (+) Poor Dentition, Missing, Chipped, Dental Advisory Given   Pulmonary sleep apnea , COPD, Current Smoker,    + rhonchi  + wheezing      Cardiovascular hypertension, Pt. on medications and Pt. on home beta blockers (-) angina+ Peripheral Vascular Disease  + dysrhythmias  Rhythm:Irregular Rate:Normal     Neuro/Psych PSYCHIATRIC DISORDERS Depression CVA (L weakness, walks with walker), Residual Symptoms    GI/Hepatic negative GI ROS, Neg liver ROS,   Endo/Other  diabetes, Insulin DependentMorbid obesity  Renal/GU ESRF and Renal InsufficiencyRenal disease (K+ 3.9, no dialysis yet)     Musculoskeletal   Abdominal (+) + obese,   Peds  Hematology  (+) Blood dyscrasia (eliquis), ,   Anesthesia Other Findings Labs appropriate, K 4.1, Na 142.Marland Kitchen Glucose 152.. Is off and on sleeping this am, takes clonidine and oxycodone this am.. Denies chest pain or angina.Marland Kitchen He is afebrile  Patient is morbidly obese with OSA  Did receive BB and amlodipine  Reproductive/Obstetrics                           Anesthesia Physical  Anesthesia Plan  ASA: III  Anesthesia Plan: General   Post-op Pain Management:    Induction: Intravenous  Airway Management Planned: LMA  Additional Equipment:   Intra-op Plan:   Post-operative Plan: Extubation in OR  Informed Consent: I have reviewed the patients History and Physical, chart, labs and discussed the procedure including the risks, benefits and alternatives for the proposed  anesthesia with the patient or authorized representative who has indicated his/her understanding and acceptance.   Dental advisory given and Dental Advisory Given  Plan Discussed with: Surgeon, CRNA and Anesthesiologist  Anesthesia Plan Comments: (GA with LMA)      Anesthesia Quick Evaluation

## 2015-03-14 NOTE — Anesthesia Procedure Notes (Signed)
Procedure Name: LMA Insertion Date/Time: 03/14/2015 10:01 AM Performed by: Carmela Rima Pre-anesthesia Checklist: Patient being monitored, Suction available, Emergency Drugs available, Patient identified and Timeout performed Patient Re-evaluated:Patient Re-evaluated prior to inductionOxygen Delivery Method: Circle system utilized Preoxygenation: Pre-oxygenation with 100% oxygen Intubation Type: IV induction Ventilation: Mask ventilation without difficulty LMA: LMA inserted LMA Size: 4.0 Number of attempts: 1 Placement Confirmation: positive ETCO2,  ETT inserted through vocal cords under direct vision and breath sounds checked- equal and bilateral Tube secured with: Tape Dental Injury: Teeth and Oropharynx as per pre-operative assessment

## 2015-03-14 NOTE — Interval H&P Note (Signed)
History and Physical Interval Note:  03/14/2015 9:22 AM  Derrick Howell  has presented today for surgery, with the diagnosis of Stage IV Chronic Kidney Disease N18.4  The various methods of treatment have been discussed with the patient and family. After consideration of risks, benefits and other options for treatment, the patient has consented to  Procedure(s): SECOND STAGE BASILIC VEIN TRANSPOSITION (Right) as a surgical intervention .  The patient's history has been reviewed, patient examined, no change in status, stable for surgery.  I have reviewed the patient's chart and labs.  Questions were answered to the patient's satisfaction.     Durene Cal

## 2015-03-14 NOTE — Transfer of Care (Signed)
Immediate Anesthesia Transfer of Care Note  Patient: Derrick Howell  Procedure(s) Performed: Procedure(s): Right Arm SECOND STAGE BASILIC VEIN TRANSPOSITION (Right)  Patient Location: PACU  Anesthesia Type:General  Level of Consciousness: awake, alert  and oriented  Airway & Oxygen Therapy: Patient Spontanous Breathing and Patient connected to face mask oxygen  Post-op Assessment: Report given to RN, Post -op Vital signs reviewed and stable and Patient moving all extremities X 4  Post vital signs: Reviewed and stable  Last Vitals:  Filed Vitals:   03/14/15 0833  BP: 165/71  Pulse: 65  Temp: 36.3 C    Complications: No apparent anesthesia complications

## 2015-03-15 NOTE — Op Note (Signed)
    Patient name: Derrick Howell MRN: [id number] DOB: Sep 20, 1945 Sex: male  03/14/2015 Pre-operative Diagnosis: End-stage renal disease Post-operative diagnosis:  Same Surgeon:  Durene Cal Assistants:  Karsten Ro Procedure:   #1: Second stage right basilic vein transposition   #2: Primary end to end venous anastomosis Anesthesia:  Gen. Blood Loss:  See anesthesia record Specimens:  None  Findings:  Excellent vein.  I did have to transect the vein in order to make it superficial to the cutaneous nerves that were enveloping the vein in the mid arm  Indications:  The patient presented with right first stage basilic vein transposition.  Ultrasound showed adequate fistula for second stage.  He is here today for the procedure.  Procedure:  The patient was identified in the holding area and taken to [facility] OR ROOM 12  The patient was then placed supine on the table. general anesthesia was administered.  The patient was prepped and draped in the usual sterile fashion.  A time out was called and antibiotics were administered.  One percent lidocaine was used for local anesthesia.  A longitudinal incision was made in the antecubital crease.  This vein was dissected free.  It was an excellent caliber vein measuring proximally 6 mm.  Cutaneous nerves were identified and protected.  Side branches were ligated between 3-0 silk ties.  2 separate incisions were performed and the vein was fully mobilized.  The cutaneous nerve had multiple branches and enveloped the vein to the point where I did not feel like I could safely elevate the fistula.  Therefore I had to ligate the fistula.  Before doing this I marked the vein with a ink pen for orientation.  The vein was transected and brought superficial to the nerves.  A primary end to end anastomosis was then created with running 5-0 Prolene.  Upon completion, the patient continued to have an excellent thrill within the fistula.  In order to elevate the vein I  reapproximated the subcutaneous tissue posterior to the vein using interrupted figure-of-eight 2-0 Vicryl suture.  The skin was closed directly anterior to the vein with running 4-0 Vicryl.  Dermabond was then applied.   Disposition:  To PACU in stable condition.   Juleen China, M.D. [facility] [place] Office: [phone] Pager:  [phone]

## 2015-03-17 ENCOUNTER — Encounter (HOSPITAL_COMMUNITY): Payer: Self-pay | Admitting: Surgery

## 2015-03-18 ENCOUNTER — Telehealth: Payer: Self-pay | Admitting: Surgery

## 2015-03-18 NOTE — Telephone Encounter (Signed)
Spoke with Elon Jester at [facility] to schedule the appt, had to adjust due to patient transportation, dpm

## 2015-03-18 NOTE — Telephone Encounter (Signed)
-----   Message from Sharee Pimple, RN sent at 03/14/2015 12:26 PM EST ----- Regarding: schedule   ----- Message -----    From: Raymond Gurney, PA-C    Sent: 03/14/2015  12:24 PM      To: Vvs Charge Pool  S/p right arm second stage BVT 03/14/15  F/u with Dr. Myra Gianotti in 4 weeks  Thanks Selena Batten

## 2015-04-07 ENCOUNTER — Encounter: Payer: Medicare Other | Admitting: Surgery

## 2015-04-07 ENCOUNTER — Encounter (HOSPITAL_COMMUNITY): Payer: Medicare Other

## 2015-04-08 ENCOUNTER — Encounter (HOSPITAL_COMMUNITY): Payer: Self-pay | Admitting: Family Medicine

## 2015-04-08 ENCOUNTER — Inpatient Hospital Stay (HOSPITAL_COMMUNITY): Payer: Medicare Other

## 2015-04-08 ENCOUNTER — Inpatient Hospital Stay (HOSPITAL_COMMUNITY)
Admission: EM | Admit: 2015-04-08 | Discharge: 2015-04-15 | DRG: 314 | Disposition: A | Discharge status: Home / Self Care | Payer: Medicare Other | Source: Other Acute Inpatient Hospital | Attending: Family Medicine | Admitting: Family Medicine

## 2015-04-08 DIAGNOSIS — D638 Anemia in other chronic diseases classified elsewhere: Secondary | ICD-10-CM | POA: Diagnosis present

## 2015-04-08 DIAGNOSIS — E1121 Type 2 diabetes mellitus with diabetic nephropathy: Secondary | ICD-10-CM

## 2015-04-08 DIAGNOSIS — I481 Persistent atrial fibrillation: Secondary | ICD-10-CM

## 2015-04-08 DIAGNOSIS — I129 Hypertensive chronic kidney disease with stage 1 through stage 4 chronic kidney disease, or unspecified chronic kidney disease: Secondary | ICD-10-CM | POA: Diagnosis present

## 2015-04-08 DIAGNOSIS — T827XXA Infection and inflammatory reaction due to other cardiac and vascular devices, implants and grafts, initial encounter: Secondary | ICD-10-CM | POA: Diagnosis present

## 2015-04-08 DIAGNOSIS — F17210 Nicotine dependence, cigarettes, uncomplicated: Secondary | ICD-10-CM | POA: Diagnosis present

## 2015-04-08 DIAGNOSIS — Z7901 Long term (current) use of anticoagulants: Secondary | ICD-10-CM | POA: Diagnosis not present

## 2015-04-08 DIAGNOSIS — G2581 Restless legs syndrome: Secondary | ICD-10-CM | POA: Diagnosis present

## 2015-04-08 DIAGNOSIS — Y832 Surgical operation with anastomosis, bypass or graft as the cause of abnormal reaction of the patient, or of later complication, without mention of misadventure at the time of the procedure: Secondary | ICD-10-CM | POA: Diagnosis present

## 2015-04-08 DIAGNOSIS — D631 Anemia in chronic kidney disease: Secondary | ICD-10-CM | POA: Diagnosis not present

## 2015-04-08 DIAGNOSIS — N189 Chronic kidney disease, unspecified: Secondary | ICD-10-CM

## 2015-04-08 DIAGNOSIS — I4891 Unspecified atrial fibrillation: Secondary | ICD-10-CM | POA: Diagnosis present

## 2015-04-08 DIAGNOSIS — F329 Major depressive disorder, single episode, unspecified: Secondary | ICD-10-CM | POA: Diagnosis present

## 2015-04-08 DIAGNOSIS — R509 Fever, unspecified: Secondary | ICD-10-CM

## 2015-04-08 DIAGNOSIS — J449 Chronic obstructive pulmonary disease, unspecified: Secondary | ICD-10-CM | POA: Diagnosis present

## 2015-04-08 DIAGNOSIS — N184 Chronic kidney disease, stage 4 (severe): Secondary | ICD-10-CM | POA: Diagnosis present

## 2015-04-08 DIAGNOSIS — E1122 Type 2 diabetes mellitus with diabetic chronic kidney disease: Secondary | ICD-10-CM | POA: Diagnosis present

## 2015-04-08 DIAGNOSIS — T827XXS Infection and inflammatory reaction due to other cardiac and vascular devices, implants and grafts, sequela: Secondary | ICD-10-CM | POA: Diagnosis not present

## 2015-04-08 DIAGNOSIS — I120 Hypertensive chronic kidney disease with stage 5 chronic kidney disease or end stage renal disease: Secondary | ICD-10-CM | POA: Diagnosis present

## 2015-04-08 DIAGNOSIS — E875 Hyperkalemia: Secondary | ICD-10-CM | POA: Diagnosis not present

## 2015-04-08 DIAGNOSIS — L0390 Cellulitis, unspecified: Secondary | ICD-10-CM | POA: Diagnosis present

## 2015-04-08 DIAGNOSIS — L02413 Cutaneous abscess of right upper limb: Secondary | ICD-10-CM | POA: Diagnosis present

## 2015-04-08 DIAGNOSIS — T827XXD Infection and inflammatory reaction due to other cardiac and vascular devices, implants and grafts, subsequent encounter: Secondary | ICD-10-CM | POA: Diagnosis not present

## 2015-04-08 DIAGNOSIS — T814XXA Infection following a procedure, initial encounter: Secondary | ICD-10-CM | POA: Diagnosis present

## 2015-04-08 DIAGNOSIS — Z794 Long term (current) use of insulin: Secondary | ICD-10-CM | POA: Diagnosis not present

## 2015-04-08 DIAGNOSIS — L03113 Cellulitis of right upper limb: Secondary | ICD-10-CM | POA: Diagnosis not present

## 2015-04-08 DIAGNOSIS — N186 End stage renal disease: Secondary | ICD-10-CM | POA: Diagnosis present

## 2015-04-08 DIAGNOSIS — N185 Chronic kidney disease, stage 5: Secondary | ICD-10-CM

## 2015-04-08 DIAGNOSIS — J4489 Other specified chronic obstructive pulmonary disease: Secondary | ICD-10-CM | POA: Diagnosis present

## 2015-04-08 DIAGNOSIS — Z8673 Personal history of transient ischemic attack (TIA), and cerebral infarction without residual deficits: Secondary | ICD-10-CM | POA: Diagnosis not present

## 2015-04-08 DIAGNOSIS — I482 Chronic atrial fibrillation: Secondary | ICD-10-CM | POA: Diagnosis not present

## 2015-04-08 DIAGNOSIS — I69359 Hemiplegia and hemiparesis following cerebral infarction affecting unspecified side: Secondary | ICD-10-CM

## 2015-04-08 LAB — CBC WITH DIFFERENTIAL/PLATELET
Basophils Absolute: 0 10*3/uL (ref 0.0–0.1)
Basophils Relative: 0 %
EOS ABS: 0.6 10*3/uL (ref 0.0–0.7)
EOS PCT: 6 %
HCT: 28.2 % — ABNORMAL LOW (ref 39.0–52.0)
Hemoglobin: 9.2 g/dL — ABNORMAL LOW (ref 13.0–17.0)
LYMPHS ABS: 0.8 10*3/uL (ref 0.7–4.0)
Lymphocytes Relative: 8 %
MCH: 31 pg (ref 26.0–34.0)
MCHC: 32.6 g/dL (ref 30.0–36.0)
MCV: 94.9 fL (ref 78.0–100.0)
MONO ABS: 0.9 10*3/uL (ref 0.1–1.0)
MONOS PCT: 9 %
Neutro Abs: 7.9 10*3/uL — ABNORMAL HIGH (ref 1.7–7.7)
Neutrophils Relative %: 77 %
PLATELETS: 142 10*3/uL — AB (ref 150–400)
RBC: 2.97 MIL/uL — ABNORMAL LOW (ref 4.22–5.81)
RDW: 14.1 % (ref 11.5–15.5)
WBC: 10.3 10*3/uL (ref 4.0–10.5)

## 2015-04-08 LAB — COMPREHENSIVE METABOLIC PANEL
ALT: 13 U/L — AB (ref 17–63)
AST: 17 U/L (ref 15–41)
Albumin: 2.7 g/dL — ABNORMAL LOW (ref 3.5–5.0)
Alkaline Phosphatase: 75 U/L (ref 38–126)
Anion gap: 14 (ref 5–15)
BUN: 37 mg/dL — AB (ref 6–20)
CHLORIDE: 103 mmol/L (ref 101–111)
CO2: 26 mmol/L (ref 22–32)
CREATININE: 3.91 mg/dL — AB (ref 0.61–1.24)
Calcium: 9 mg/dL (ref 8.9–10.3)
GFR calc Af Amer: 17 mL/min — ABNORMAL LOW (ref >60)
GFR, EST NON AFRICAN AMERICAN: 14 mL/min — AB (ref >60)
GLUCOSE: 148 mg/dL — AB (ref 65–99)
Potassium: 3.9 mmol/L (ref 3.5–5.1)
SODIUM: 143 mmol/L (ref 135–145)
Total Bilirubin: 0.5 mg/dL (ref 0.3–1.2)
Total Protein: 6.4 g/dL — ABNORMAL LOW (ref 6.5–8.1)

## 2015-04-08 LAB — GLUCOSE, CAPILLARY: GLUCOSE-CAPILLARY: 141 mg/dL — AB (ref 65–99)

## 2015-04-08 MED ORDER — VANCOMYCIN HCL 10 G IV SOLR
2250.0000 mg | Freq: Once | INTRAVENOUS | Status: AC
  Administered 2015-04-08: 2250 mg via INTRAVENOUS
  Filled 2015-04-08 (×3): qty 2250

## 2015-04-08 MED ORDER — HYDRALAZINE HCL 50 MG PO TABS
100.0000 mg | ORAL_TABLET | Freq: Four times a day (QID) | ORAL | Status: DC
  Administered 2015-04-08 – 2015-04-15 (×28): 100 mg via ORAL
  Filled 2015-04-08 (×33): qty 2

## 2015-04-08 MED ORDER — CALCITRIOL 0.25 MCG PO CAPS
0.2500 ug | ORAL_CAPSULE | Freq: Every day | ORAL | Status: DC
Start: 2015-04-08 — End: 2015-04-15
  Administered 2015-04-08 – 2015-04-15 (×8): 0.25 ug via ORAL
  Filled 2015-04-08 (×8): qty 1

## 2015-04-08 MED ORDER — MELATONIN 5 MG PO TABS: 1.0000 | ORAL_TABLET | Freq: Every day | ORAL | Status: DC

## 2015-04-08 MED ORDER — CLONIDINE HCL 0.2 MG PO TABS
0.2000 mg | ORAL_TABLET | Freq: Three times a day (TID) | ORAL | Status: DC
  Administered 2015-04-08 – 2015-04-15 (×22): 0.2 mg via ORAL
  Filled 2015-04-08: qty 2
  Filled 2015-04-08: qty 1
  Filled 2015-04-08 (×2): qty 2
  Filled 2015-04-08 (×5): qty 1
  Filled 2015-04-08: qty 2
  Filled 2015-04-08 (×11): qty 1
  Filled 2015-04-08: qty 2
  Filled 2015-04-08: qty 1

## 2015-04-08 MED ORDER — MIRTAZAPINE 15 MG PO TABS
15.0000 mg | ORAL_TABLET | Freq: Every day | ORAL | Status: DC
  Administered 2015-04-08 – 2015-04-14 (×7): 15 mg via ORAL
  Filled 2015-04-08 (×7): qty 1

## 2015-04-08 MED ORDER — DOCUSATE SODIUM 100 MG PO CAPS
100.0000 mg | ORAL_CAPSULE | Freq: Two times a day (BID) | ORAL | Status: DC
  Administered 2015-04-08 – 2015-04-15 (×15): 100 mg via ORAL
  Filled 2015-04-08 (×15): qty 1

## 2015-04-08 MED ORDER — INSULIN GLARGINE 100 UNIT/ML ~~LOC~~ SOLN
25.0000 [IU] | Freq: Every day | SUBCUTANEOUS | Status: DC
  Administered 2015-04-08 – 2015-04-14 (×7): 25 [IU] via SUBCUTANEOUS
  Filled 2015-04-08 (×8): qty 0.25

## 2015-04-08 MED ORDER — PRAMIPEXOLE DIHYDROCHLORIDE 0.25 MG PO TABS
0.5000 mg | ORAL_TABLET | Freq: Every evening | ORAL | Status: DC
  Administered 2015-04-08 – 2015-04-14 (×7): 0.5 mg via ORAL
  Filled 2015-04-08 (×9): qty 2

## 2015-04-08 MED ORDER — AMLODIPINE BESYLATE 10 MG PO TABS
10.0000 mg | ORAL_TABLET | Freq: Every day | ORAL | Status: DC
  Administered 2015-04-08 – 2015-04-15 (×8): 10 mg via ORAL
  Filled 2015-04-08 (×9): qty 1

## 2015-04-08 MED ORDER — APIXABAN 2.5 MG PO TABS
2.5000 mg | ORAL_TABLET | Freq: Two times a day (BID) | ORAL | Status: DC
  Administered 2015-04-08 – 2015-04-15 (×15): 2.5 mg via ORAL
  Filled 2015-04-08 (×15): qty 1

## 2015-04-08 MED ORDER — SULFAMETHOXAZOLE-TRIMETHOPRIM 800-160 MG PO TABS
2.0000 | ORAL_TABLET | Freq: Two times a day (BID) | ORAL | Status: DC
  Administered 2015-04-08: 2 via ORAL
  Filled 2015-04-08: qty 2

## 2015-04-08 MED ORDER — VITAMIN D 1000 UNITS PO TABS
2000.0000 [IU] | ORAL_TABLET | Freq: Every day | ORAL | Status: DC
  Administered 2015-04-08 – 2015-04-15 (×8): 2000 [IU] via ORAL
  Filled 2015-04-08 (×8): qty 2

## 2015-04-08 MED ORDER — SODIUM BICARBONATE 650 MG PO TABS
650.0000 mg | ORAL_TABLET | Freq: Two times a day (BID) | ORAL | Status: DC
  Administered 2015-04-08 – 2015-04-15 (×15): 650 mg via ORAL
  Filled 2015-04-08 (×15): qty 1

## 2015-04-08 MED ORDER — OXYCODONE HCL 5 MG PO TABS
5.0000 mg | ORAL_TABLET | ORAL | Status: DC | PRN
  Administered 2015-04-09 – 2015-04-12 (×2): 5 mg via ORAL
  Filled 2015-04-08 (×2): qty 1

## 2015-04-08 MED ORDER — VANCOMYCIN HCL 10 G IV SOLR
2250.0000 mg | Freq: Once | INTRAVENOUS | Status: DC
  Filled 2015-04-08 (×2): qty 2250

## 2015-04-08 MED ORDER — PNEUMOCOCCAL VAC POLYVALENT 25 MCG/0.5ML IJ INJ
0.5000 mL | INJECTION | INTRAMUSCULAR | Status: AC
  Administered 2015-04-09: 0.5 mL via INTRAMUSCULAR
  Filled 2015-04-08: qty 0.5

## 2015-04-08 MED ORDER — BUMETANIDE 2 MG PO TABS
3.0000 mg | ORAL_TABLET | Freq: Two times a day (BID) | ORAL | Status: DC
  Administered 2015-04-08 – 2015-04-15 (×15): 3 mg via ORAL
  Filled 2015-04-08 (×19): qty 1

## 2015-04-08 MED ORDER — METOPROLOL TARTRATE 100 MG PO TABS
100.0000 mg | ORAL_TABLET | Freq: Two times a day (BID) | ORAL | Status: DC
  Administered 2015-04-08 – 2015-04-15 (×15): 100 mg via ORAL
  Filled 2015-04-08 (×15): qty 1

## 2015-04-08 MED ORDER — FERROUS SULFATE 325 (65 FE) MG PO TABS
325.0000 mg | ORAL_TABLET | Freq: Three times a day (TID) | ORAL | Status: DC
  Administered 2015-04-08 – 2015-04-15 (×22): 325 mg via ORAL
  Filled 2015-04-08 (×23): qty 1

## 2015-04-08 MED ORDER — DILTIAZEM HCL ER COATED BEADS 120 MG PO CP24
240.0000 mg | ORAL_CAPSULE | Freq: Every day | ORAL | Status: DC
  Administered 2015-04-08 – 2015-04-15 (×8): 240 mg via ORAL
  Filled 2015-04-08 (×2): qty 2
  Filled 2015-04-08 (×2): qty 1
  Filled 2015-04-08 (×6): qty 2
  Filled 2015-04-08: qty 1

## 2015-04-08 NOTE — Progress Notes (Signed)
Utilization review completed. Annisten Manchester, RN, BSN. 

## 2015-04-08 NOTE — Progress Notes (Signed)
Pharmacy Antibiotic Note  Derrick Howell is a 70 y.o. male admitted on 04/08/2015 with purulent cellulitis at site of his recent fistula graft.  Pharmacy has been consulted for Vancomycin dosing for cellulitis.  Assessment:  H/o ESRD not yet started on hemodialysis yet. S/p recent fistula surgery, the second stage of surgery occurred 3 weeks ago here at [facility]  Patient from nursing home, went to ED in [place], [place] for due to the fistula site was "sore", draining, and swollen. Blood cultures were drawn and Unasyn was administered before transfer to [facility] Repeat blood culture done on admission. Past Medical History  Diagnosis Date  . Hyperlipidemia   . Hypertension   . COPD (chronic obstructive pulmonary disease) (HCC)   . Stroke (HCC)   . Atrial fibrillation (HCC)   . Depression   . OSA (obstructive sleep apnea)   . Diabetes mellitus without complication (HCC)   . CKD (chronic kidney disease)   . Peripheral vascular disease (HCC)     Left side     Plan:   Vancomycin 2250 mg IV x1 Not started on dialysis yet. Monitor renal function. Redose IV vancomycin as needed based on level or post HD if HD started.    Height:  (170.2 cm) Weight: 227 lb 11.8 oz (103.3 kg) IBW/kg (Calculated) : 66.1  Temp (24hrs), Avg:98.9 F (37.2 C), Min:98.6 F (37 C), Max:99.2 F (37.3 C)   Recent Labs Lab 04/08/15 0719  WBC 10.3  CREATININE 3.91*    Estimated Creatinine Clearance: 20.7 mL/min (by C-G formula based on Cr of 3.91).    No Known Allergies  Antimicrobials this admission: Unasyn at ED in [place], [place] Oral Bactrim 2/14 x1 Vancomycin 2/14 >>  Dose adjustments this admission: Starting IV vancomycin today 04/08/15.  Microbiology results: 2/14 BCx: pending 2/14 Blood cultures were drawn at ED in [place], [place]: pending    Thank you for allowing pharmacy to be a part of this patient's care. Noah Delaine, RPh Clinical Pharmacist Pager: [phone] 04/08/2015 11:19 AM

## 2015-04-08 NOTE — Progress Notes (Signed)
[facility] PROGRESS NOTE  Derrick Howell ZOX:[id number] DOB: 13-Feb-1946 DOA: 04/08/2015 PCP: No PCP Per Patient  Please refer to admission H&P from early this morning in details, 70 year old male with ESRD not on dialysis with a recent right arm AV graft placed (one 03/14/2015 by Dr. Myra Gianotti), hypertension, A. fib on Eliquis, diabetes mellitus, history of CVA with residual left-sided weakness presented with pain and purulent drainage from the fistula site. Patient admitted for possible infection with abscess over the fistula site. Vascular surgery consulted.  Assessment/Plan: AV graft site infection with possible abscess Blood cultures drawn at [facility] with you first presented. Repeat blood culture done on admission. Placed on oral Bactrim for MRSA which may further contribute to worsen his renal function. Will switch to IV vancomycin. Order for ultrasound of the right arm to evaluate further. Vascular surgery consulted.   End-stage renal disease Not started on dialysis yet . Continue calcitriol, bicarbonate and bumetanide. Monitor renal function. Nephrology consult as needed.  A. fib  Continue metoprolol, Cardizem for rate control. Continue Eliquis  Essential hypertension On multiple blood pressure medications (hydralazine, amlodipine, Bumex, clonidine, Cardizem and metoprolol) all have been continued. Blood pressure stable on higher side.  Anemia of chronic disease Continue iron supplement  Insulin-dependent diabetes Continue home dose of glargine. Continue sliding scale coverage.  Left leg swelling Will monitor. Doubt DVT.   DVT prophylaxis: On eliquis   Diet: Renal/diabetic  Code Status: Full code Family Communication: None at bedside Disposition Plan: Continue inpatient monitoring. Home once symptoms resolve.   Consultants:  Vascular surgery  Procedures:  Right upper extremity ultrasound  Antibiotics:  Oral Bactrim 1  IV vancomycin since  2/14  HPI/Subjective: Seen and examined. Was sleepy during the evaluation. Has some  pain in the right arm.  Objective: Filed Vitals:   04/08/15 0456 04/08/15 1000  BP: 175/68 167/80  Pulse: 85 91  Temp: 99.2 F (37.3 C) 98.6 F (37 C)  Resp: 20 20    Intake/Output Summary (Last 24 hours) at 04/08/15 1025 Last data filed at 04/08/15 0900  Gross per 24 hour  Intake    360 ml  Output    720 ml  Net   -360 ml   Filed Weights   04/08/15 0456  Weight: 103.3 kg (227 lb 11.8 oz)    Exam:   General:  Elderly obese male lying in bed, sleepy  HEENT: Moist mucosa, supple neck   Chest: Clear bilaterally  CVS: S1 and S2 irregular, no murmurs  GI: soft,  nondistended, nontender  Musculoskeletal: warm, induration voer rt AV fistula site with purulent drainage and some tenderness  Anus: Sleepy but arousable and answering questions, chronic left-sided weakness  Data Reviewed: Basic Metabolic Panel:  Recent Labs Lab 04/08/15 0719  NA 143  K 3.9  CL 103  CO2 26  GLUCOSE 148*  BUN 37*  CREATININE 3.91*  CALCIUM 9.0   Liver Function Tests:  Recent Labs Lab 04/08/15 0719  AST 17  ALT 13*  ALKPHOS 75  BILITOT 0.5  PROT 6.4*  ALBUMIN 2.7*   No results for input(s): LIPASE, AMYLASE in the last 168 hours. No results for input(s): AMMONIA in the last 168 hours. CBC:  Recent Labs Lab 04/08/15 0719  WBC 10.3  NEUTROABS 7.9*  HGB 9.2*  HCT 28.2*  MCV 94.9  PLT 142*   Cardiac Enzymes: No results for input(s): CKTOTAL, CKMB, CKMBINDEX, TROPONINI in the last 168 hours. BNP (last 3 results) No results for input(s):  BNP in the last 8760 hours.  ProBNP (last 3 results) No results for input(s): PROBNP in the last 8760 hours.  CBG: No results for input(s): GLUCAP in the last 168 hours.  Recent Results (from the past 240 hour(s))  Culture, blood (routine x 2)     Status: None (Preliminary result)   Collection Time: 04/08/15  7:29 AM  Result Value Ref Range  Status   Specimen Description BLOOD LEFT ARM  Final   Special Requests BOTTLES DRAWN AEROBIC AND ANAEROBIC 5CC  Final   Culture PENDING  Incomplete   Report Status PENDING  Incomplete     Studies: No results found.  Scheduled Meds: . amLODipine  10 mg Oral Daily  . apixaban  2.5 mg Oral BID  . bumetanide  3 mg Oral BID  . calcitRIOL  0.25 mcg Oral Daily  . cholecalciferol  2,000 Units Oral Daily  . cloNIDine  0.2 mg Oral TID  . diltiazem  240 mg Oral Daily  . docusate sodium  100 mg Oral BID  . ferrous sulfate  325 mg Oral TID WC  . hydrALAZINE  100 mg Oral 4 times per day  . insulin glargine  25 Units Subcutaneous QHS  . metoprolol  100 mg Oral BID  . mirtazapine  15 mg Oral QHS  . pramipexole  0.5 mg Oral QPM  . sodium bicarbonate  650 mg Oral BID  . sulfamethoxazole-trimethoprim  2 tablet Oral Q12H   Continuous Infusions:     Time spent: 15 minutes    [name]  [facility] Pager [phone] If 7PM-7AM, please contact night-coverage at [email or web address], password [facility] 04/08/2015, 10:25 AM  LOS: 0 days

## 2015-04-08 NOTE — Progress Notes (Addendum)
  Postoperative hemodialysis access  Date of Surgery:  03/14/15 Surgeon: Myra Gianotti  HPI:  Pt states he was doing well until last night when he showered.  He states when he got out of the shower, the incision opened and started draining.  He states his arm was swollen yesterday also after he got out of the shower.   He has not had any fevers.  He does not have any pain in his right hand and is able to perform his ADL's with the right hand.   He is not yet on HD.  Pt is on Eliquis.  PHYSICAL EXAMINATION:  Filed Vitals:   04/08/15 0456  BP: 175/68  Pulse: 85  Temp: 99.2 F (37.3 C)  Resp: 20    Incision is distal incision is separated with serosanguinous drainage from the wound.  All other incisions are clean and dry Sensation in digits is intact;  There is  Thrill  There is bruit. The graft/fistula is palpable  1+ right radial palpable pulse  ASSESSMENT/PLAN:  Derrick Howell is a 70 y.o. year old male who is s/p 2nd stage right basilic vein transposition on 03/14/15 by Dr. Myra Gianotti.  -graft/fistula is patent -pt does not have evidence of steal sx -the pt does have erythema around the distal portion of the incision with serosanguineous drainage from the wound.  This does not appear purulent.  The right upper arm is also mildly swollen.  Recommend Abx, dressing changes and arm elevation. -pt was started on Bactrim this morning.  He did receive a dose of Unasyn before transfer yesterday from [place]. -blood cx are pending (drawn earlier today) -pt is not yet on HD  Doreatha Massed, PA-C [facility] [phone]  Agree with above. The wound in the right arm will need to be packed and this can be done at his facility once he is discharged. He can follow up with Dr. Myra Gianotti as an outpatient in approximately 2 weeks.  Waverly Ferrari, MD, FACS Beeper [phone] Office: [phone]

## 2015-04-08 NOTE — H&P (Signed)
History and Physical  Patient Name: Derrick Howell     AVW:[id number]    DOB: 1945-03-15    DOA: 04/08/2015 Referring physician: Garey Ham, MD PCP: No PCP Per Patient      Chief Complaint: Right arm pain  HPI: Derrick Howell is a 70 y.o. male with a past medical history significant for ESRD not yet on HD, HTN, A. fib on apixaban, IDDM, and history of stroke with residual left-sided weakness who presents with pain at site of recent fistula surgery.  The patient had a recent brachiobasilic fistula placed, the second stage of which are surgery occurred 3 weeks ago here [facility].  Since then, the patient has been back at his nursing home where he was recovering well until yesterday when the site of the surgery became "sore", draining, and swollen. Staff at his facility sent him to the ER at [facility]. There was no fever, confusion.  There was no cough, respiratory symptoms.  In the ED, the patient had a low-grade fever but was hemodynamically stable. K4.0, creatinine 4, glucose 126, WBC 11.4, hemoglobin 9.4. There was concern for abscess at the site of his recent fistula surgery, and so [facility] were asked to accept in transfer given lack of availability of vascular surgery at [facility]. Blood cultures were drawn and Unasyn was administered before transfer.     Review of Systems:  Pt complains of right arm pain, drainage, redness, swelling. All other systems negative except as just noted or noted in the history of present illness.  No Known Allergies  Prior to Admission medications   Medication Sig Start Date End Date Taking? Authorizing Provider  acetaminophen (TYLENOL) 500 MG tablet Take 500 mg by mouth every 4 (four) hours as needed.    Historical Provider, MD  alum & mag hydroxide-simeth (MAALOX/MYLANTA) 200-200-20 MG/5ML suspension Take by mouth every 6 (six) hours as needed for indigestion or heartburn.    Historical Provider, MD  amLODipine (NORVASC) 10 MG tablet Take 10 mg by mouth daily.     Historical Provider, MD  apixaban (ELIQUIS) 2.5 MG TABS tablet Take 1 tablet (2.5 mg total) by mouth 2 (two) times daily. 03/15/15   Raymond Gurney, PA-C  bumetanide (BUMEX) 1 MG tablet Take 3 mg by mouth 2 (two) times daily.     Historical Provider, MD  calcitRIOL (ROCALTROL) 0.25 MCG capsule Take 0.25 mcg by mouth daily.    Historical Provider, MD  Cholecalciferol (VITAMIN D3) 2000 UNITS TABS Take 1 tablet by mouth daily.    Historical Provider, MD  cloNIDine (CATAPRES) 0.2 MG tablet Take 0.2 mg by mouth 3 (three) times daily.     Historical Provider, MD  dicyclomine (BENTYL) 20 MG tablet Take 20 mg by mouth every 6 (six) hours as needed for spasms.    Historical Provider, MD  diltiazem (DILACOR XR) 240 MG 24 hr capsule Take 240 mg by mouth daily.    Historical Provider, MD  docusate sodium (COLACE) 100 MG capsule Take 100 mg by mouth 2 (two) times daily.    Historical Provider, MD  ferrous sulfate 325 (65 FE) MG tablet Take 325 mg by mouth 3 (three) times daily with meals.     Historical Provider, MD  guaiFENesin (ROBITUSSIN) 100 MG/5ML liquid Take 200 mg by mouth 4 (four) times daily as needed for cough.    Historical Provider, MD  hydrALAZINE (APRESOLINE) 100 MG tablet Take 100 mg by mouth 4 (four) times daily.    Historical Provider, MD  insulin aspart (NOVOLOG) 100 UNIT/ML injection Inject 12 Units into the skin 3 (three) times daily after meals.     Historical Provider, MD  insulin glargine (LANTUS) 100 UNIT/ML injection Inject 25 Units into the skin at bedtime.     Historical Provider, MD  loperamide (IMODIUM) 2 MG capsule Take 2 mg by mouth as needed for diarrhea or loose stools.    Historical Provider, MD  magnesium hydroxide (MILK OF MAGNESIA) 400 MG/5ML suspension Take 30 mLs by mouth at bedtime as needed for mild constipation.    Historical Provider, MD  Melatonin 5 MG TABS Take 1 tablet by mouth at bedtime.    Historical Provider, MD  metoprolol (LOPRESSOR) 100 MG tablet Take 100 mg  by mouth 2 (two) times daily.    Historical Provider, MD  mirtazapine (REMERON) 15 MG tablet Take 15 mg by mouth at bedtime.    Historical Provider, MD  ondansetron (ZOFRAN ODT) 8 MG disintegrating tablet Take 1 tablet (8 mg total) by mouth every 8 (eight) hours as needed for nausea or vomiting. 03/16/13   Azalia Bilis, MD  oxyCODONE (ROXICODONE) 5 MG immediate release tablet Take 1 tablet (5 mg total) by mouth every 4 (four) hours as needed for severe pain. 03/14/15   Raymond Gurney, PA-C  potassium chloride SA (K-DUR,KLOR-CON) 20 MEQ tablet Take 20 mEq by mouth daily.    Historical Provider, MD  pramipexole (MIRAPEX) 0.5 MG tablet Take 0.5 mg by mouth every evening.    Historical Provider, MD  sodium bicarbonate 650 MG tablet Take 650 mg by mouth 2 (two) times daily.    Historical Provider, MD  traMADol (ULTRAM) 50 MG tablet Take 50 mg by mouth every 6 (six) hours as needed for moderate pain.    Historical Provider, MD  vitamin C (ASCORBIC ACID) 500 MG tablet Take 500 mg by mouth daily.    Historical Provider, MD    Past Medical History  Diagnosis Date  . Hyperlipidemia   . Hypertension   . COPD (chronic obstructive pulmonary disease) (HCC)   . Stroke (HCC)   . Atrial fibrillation (HCC)   . Depression   . OSA (obstructive sleep apnea)   . Diabetes mellitus without complication (HCC)   . CKD (chronic kidney disease)   . Peripheral vascular disease (HCC)     Left side    Past Surgical History  Procedure Laterality Date  . Av fistula placement Right 01/03/2015    Procedure: ARTERIOVENOUS (AV) FISTULA CREATION;  Surgeon: Nada Libman, MD;  Location: [facility] OR;  Service: Vascular;  Laterality: Right;  . Bascilic vein transposition Right 03/14/2015    Procedure: Right Arm SECOND STAGE BASILIC VEIN TRANSPOSITION;  Surgeon: Nada Libman, MD;  Location: [facility] OR;  Service: Vascular;  Laterality: Right;    Family history: family history includes Diabetes in his mother; Stroke in his  mother.  Social History: Patient lives in a nursing home.  He is from [place].  He smokes, 3-4 cigarettes per day. He does not drink.  He walks with a cane, he reports.  His daughter is his POA.     Physical Exam: BP 175/68 mmHg  Pulse 85  Temp(Src) 99.2 F (37.3 C) (Oral)  Resp 20  Ht 5\' 7"  (1.702 m)  Wt 103.3 kg (227 lb 11.8 oz)  BMI 35.66 kg/m2  SpO2 98% General appearance: Well-developed, adult male, sleepy but rousable and in no acute distress.   Eyes: Anicteric, conjunctiva pink, lids and lashes normal.  ENT: No nasal deformity, discharge, or epistaxis.  OP moist without lesions.   Skin: Warm and dry.  In the right [facility], there is a small ~1 cm ulcer with small amount of seropurulent drainage with mild tenderness around. Cardiac: Regular, nl S1-S2, heart sounds distant, 1/6 systolic murmur.  Capillary refill is brisk.  JVP normal.  Left greater than right, mild pitting LE edema.  Radial and DP pulses 2+ and symmetric. Respiratory: Normal respiratory rate and rhythm.  CTAB with expiratory wheezes. Abdomen: Abdomen soft without rigidity.  No TTP. No ascites, distension.   MSK: No deformities or effusions.  Left leg mildly swollen.   Neuro: Oriented to person, place, and time.  Cranial nerves grossly normal.  Left sided hemiparesis.  Sensorium intact and responding to questions, attention normal.  Speech is fluent.   Psych: Behavior appropriate.  Affect normal.  No evidence of aural or visual hallucinations or delusions.       Labs on Admission:  The metabolic panel shows normal sodium, potassium, bicarbonate. The serum creatinine is 4, and the BUN is 42. Glucose is normal. The complete blood count shows leukocytosis, WBC 11.4 K/uL, chronic normocytic anemia, hemoglobin 9.4 g/dL, mild thrombocytopenia.    Assessment/Plan 1. Purulent infection at graft site:  This is new.  Purulent drainage, redness and pain at the site of his graft. Blood cultures drawn at outside  hospital. -Repeat blood culture -Bactrim oral for MRSA coverage, mild cellulitis at present -Consult to vascular surgery, appreciate cares   2. HTN:  -Continue home hydralazine, amlodipine, bumetanide, clonidine, diltiazem, metoprolol  3. IDDM:  -Continue home glargine -Check HgbA1c -Sliding scale corrections with meals  4. ESRD not yet on HD:  -Continue home bumetanide, sodium bicarbonate -Continue home calcitriol -Daily BMP  5. Afib on Eliquis:  CHADS2Vasc 4, on Eliquis and rate control. -Continue apixaban, metoprolol and diltiazem  6. Anemia of renal disease:  -Continue home iron  7. Restless leg:  -Continue home pramipexole  8. Depression: -Continue home mirtazapine at night  9. Left leg swelling: On apixaban at [facility] [place] [facility], DVT doubted, and so will defer ultrasound.     DVT PPx: Apixaban Diet: Renal, carb modified Consultants: Vascular Code Status: FULL Family Communication: None present  Medical decision making: What exists of the patient's previous chart and outside chart from [facility] was reviewed in depth and the case was discussed with Dr. Toniann Fail who had accepted the patient in transfer. Patient seen 5:30 AM on 04/08/2015.  Disposition Plan:  I recommend admission to med surg inpatient status.  Clinical condition: stable.  Anticipate evaluation by Vascular surgery, initiation of oral antibiotics, follow blood cultures and disposition per [facility].      Alberteen Sam [facility] Pager [phone]

## 2015-04-09 ENCOUNTER — Inpatient Hospital Stay (HOSPITAL_COMMUNITY): Payer: Medicare Other

## 2015-04-09 LAB — BASIC METABOLIC PANEL
ANION GAP: 12 (ref 5–15)
BUN: 37 mg/dL — AB (ref 6–20)
CHLORIDE: 105 mmol/L (ref 101–111)
CO2: 26 mmol/L (ref 22–32)
Calcium: 8.6 mg/dL — ABNORMAL LOW (ref 8.9–10.3)
Creatinine, Ser: 4.41 mg/dL — ABNORMAL HIGH (ref 0.61–1.24)
GFR calc Af Amer: 15 mL/min — ABNORMAL LOW (ref >60)
GFR calc non Af Amer: 13 mL/min — ABNORMAL LOW (ref >60)
Glucose, Bld: 101 mg/dL — ABNORMAL HIGH (ref 65–99)
POTASSIUM: 3.8 mmol/L (ref 3.5–5.1)
SODIUM: 143 mmol/L (ref 135–145)

## 2015-04-09 LAB — GLUCOSE, CAPILLARY
GLUCOSE-CAPILLARY: 130 mg/dL — AB (ref 65–99)
Glucose-Capillary: 116 mg/dL — ABNORMAL HIGH (ref 65–99)

## 2015-04-09 LAB — MRSA PCR SCREENING: MRSA BY PCR: POSITIVE — AB

## 2015-04-09 MED ORDER — CHLORHEXIDINE GLUCONATE CLOTH 2 % EX PADS
6.0000 | MEDICATED_PAD | Freq: Every day | CUTANEOUS | Status: AC
Start: 2015-04-09 — End: 2015-04-14
  Administered 2015-04-09 – 2015-04-13 (×4): 6 via TOPICAL

## 2015-04-09 MED ORDER — SODIUM CHLORIDE 0.9 % IV BOLUS (SEPSIS)
250.0000 mL | Freq: Once | INTRAVENOUS | Status: AC
  Administered 2015-04-09: 250 mL via INTRAVENOUS

## 2015-04-09 MED ORDER — ACETAMINOPHEN 325 MG PO TABS
650.0000 mg | ORAL_TABLET | ORAL | Status: DC | PRN
  Administered 2015-04-09 – 2015-04-11 (×3): 650 mg via ORAL
  Filled 2015-04-09 (×3): qty 2

## 2015-04-09 MED ORDER — MUPIROCIN 2 % EX OINT
1.0000 | TOPICAL_OINTMENT | Freq: Two times a day (BID) | CUTANEOUS | Status: AC
Start: 2015-04-09 — End: 2015-04-13
  Administered 2015-04-09 – 2015-04-13 (×10): 1 via NASAL
  Filled 2015-04-09 (×2): qty 22

## 2015-04-09 NOTE — Progress Notes (Addendum)
[facility] PROGRESS NOTE  Derrick Howell ZOX:[id number] DOB: Oct 28, 1945 DOA: 04/08/2015 PCP: No PCP Per Patient  70 year old male with ESRD not on dialysis with a recent right arm AV graft placed (one 03/14/2015 by Dr. Myra Gianotti), hypertension, A. fib on Eliquis, diabetes mellitus, history of CVA with residual left-sided weakness presented with pain and purulent drainage from the fistula site. Patient admitted for possible infection with abscess over the fistula site. Vascular surgery consulted.  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 Repeat blood culture done on admission.  - Pharmacy consulted for vancomycin administration. - Vascular surgery following, plan is to continue antibiotics, daily dressing changes and arm elevation.   End-stage renal disease Not started on dialysis yet . Continue calcitriol, bicarbonate and bumetanide. Monitor renal function. Nephrology consult as needed. If serum creatinine bumps up will plan on consulting nephro. May be secondary to recent bactrim use - BMP next am.  A. fib  Continue metoprolol, Cardizem for rate control. Continue Eliquis  Essential hypertension On multiple blood pressure medications (hydralazine, amlodipine, Bumex, clonidine, Cardizem and metoprolol) all have been continued. Blood pressure stable on higher side.  Anemia of chronic disease Continue iron supplement  Insulin-dependent diabetes Continue home dose of glargine. Continue sliding scale coverage.  Left leg swelling Will monitor. Doubt DVT.   DVT prophylaxis: On eliquis   Diet: Renal/diabetic  Code Status: Full code Family Communication: None at bedside Disposition Plan: Continue inpatient monitoring. Home once symptoms resolve.   Consultants:  Vascular surgery  Procedures:  Right upper extremity ultrasound  Antibiotics:  Oral Bactrim 1  IV vancomycin since  2/14  HPI/Subjective: Pt has no new complaints, no acute issues overnight.  Objective: Filed Vitals:   04/09/15 0449 04/09/15 1000  BP: 150/57 155/60  Pulse: 73 79  Temp: 99.5 F (37.5 C) 98.9 F (37.2 C)  Resp:  20    Intake/Output Summary (Last 24 hours) at 04/09/15 1631 Last data filed at 04/09/15 1348  Gross per 24 hour  Intake    600 ml  Output   1925 ml  Net  -1325 ml   Filed Weights   04/08/15 0456  Weight: 103.3 kg (227 lb 11.8 oz)    Exam:   General:  Elderly obese male lying in bed, alert and awake  HEENT: Moist mucosa, supple neck   Chest: Clear bilaterally, no wheezes  CVS: S1 and S2 irregular, no murmurs  GI: soft,  nondistended, nontender  Musculoskeletal: warm, induration over rt AV fistula site   Addendum: Sleepy but arousable and answering questions, chronic left-sided weakness  Data Reviewed: Basic Metabolic Panel:  Recent Labs Lab 04/08/15 0719 04/09/15 0802  NA 143 143  K 3.9 3.8  CL 103 105  CO2 26 26  GLUCOSE 148* 101*  BUN 37* 37*  CREATININE 3.91* 4.41*  CALCIUM 9.0 8.6*   Liver Function Tests:  Recent Labs Lab 04/08/15 0719  AST 17  ALT 13*  ALKPHOS 75  BILITOT 0.5  PROT 6.4*  ALBUMIN 2.7*   No results for input(s): LIPASE, AMYLASE in the last 168 hours. No results for input(s): AMMONIA in the last 168 hours. CBC:  Recent Labs Lab 04/08/15 0719  WBC 10.3  NEUTROABS 7.9*  HGB 9.2*  HCT 28.2*  MCV 94.9  PLT 142*   Cardiac Enzymes: No results for input(s): CKTOTAL, CKMB, CKMBINDEX, TROPONINI in the last 168 hours. BNP (last 3 results) No results for input(s): BNP  in the last 8760 hours.  ProBNP (last 3 results) No results for input(s): PROBNP in the last 8760 hours.  CBG:  Recent Labs Lab 04/22/2015 2229  GLUCAP 141*    Recent Results (from the past 240 hour(s))  Culture, blood (routine x 2)     Status: None (Preliminary result)   Collection Time: 2015-04-22  7:29 AM  Result Value Ref Range  Status   Specimen Description BLOOD LEFT ARM  Final   Special Requests BOTTLES DRAWN AEROBIC AND ANAEROBIC 5CC  Final   Culture NO GROWTH 1 DAY  Final   Report Status PENDING  Incomplete  Culture, blood (routine x 2)     Status: None (Preliminary result)   Collection Time: 04/22/15  2:33 PM  Result Value Ref Range Status   Specimen Description BLOOD LEFT ARM  Final   Special Requests BOTTLES DRAWN AEROBIC ONLY 10 CC  Final   Culture NO GROWTH < 24 HOURS  Final   Report Status PENDING  Incomplete  MRSA PCR Screening     Status: Abnormal   Collection Time: 04-22-2015  9:26 PM  Result Value Ref Range Status   MRSA by PCR POSITIVE (A) NEGATIVE Final    Comment:        The GeneXpert MRSA Assay (FDA approved for NASAL specimens only), is one component of a comprehensive MRSA colonization surveillance program. It is not intended to diagnose MRSA infection nor to guide or monitor treatment for MRSA infections. RESULT CALLED TO, READ BACK BY AND VERIFIED WITH: C BOKIAGON,RN  04/09/15 MKELLY      Studies: [place] Extrem Up Right Ltd  2015-04-22  CLINICAL DATA:  Right arm AV fistula graft surgery 03/14/2015 with right arm swelling, erythema and drainage. Evaluate for abscess. EXAM: ULTRASOUND RIGHT UPPER EXTREMITY LIMITED TECHNIQUE: Ultrasound examination of the upper extremity soft tissues was performed in the area of clinical concern. COMPARISON:  None. FINDINGS: Adjacent to the fistula in the right arm and is a complex presumed fluid collection measuring up to 5.5 x 2.6 cm transverse and extending nearly 19 cm in length. This collection demonstrates heterogeneous echogenic components and a single area of blood flow which may reflect an underlying normal blood vessel. The graft appears patent. No pseudoaneurysm identified. Complete vascular assessment of the graft not performed. IMPRESSION: Complex, presumed fluid collection adjacent to the right arm graft may reflect a large hematoma or  abscess. Correlate clinically. This collection could be sampled under ultrasound if clinically warranted but does not appear drainable. CT angiogram could be performed if there is concern of leak or pseudoaneurysm, not demonstrated by this exam. Electronically Signed   By: Carey Bullocks M.D.   On: 2015/04/22 11:15    Scheduled Meds: . amLODipine  10 mg Oral Daily  . apixaban  2.5 mg Oral BID  . bumetanide  3 mg Oral BID  . calcitRIOL  0.25 mcg Oral Daily  . Chlorhexidine Gluconate Cloth  6 each Topical Q0600  . cholecalciferol  2,000 Units Oral Daily  . cloNIDine  0.2 mg Oral TID  . diltiazem  240 mg Oral Daily  . docusate sodium  100 mg Oral BID  . ferrous sulfate  325 mg Oral TID WC  . hydrALAZINE  100 mg Oral 4 times per day  . insulin glargine  25 Units Subcutaneous QHS  . metoprolol  100 mg Oral BID  . mirtazapine  15 mg Oral QHS  . mupirocin ointment  1 application Nasal BID  . pramipexole  0.5 mg Oral QPM  . sodium bicarbonate  650 mg Oral BID   Continuous Infusions:     Time spent: 15 minutes    Penny Pia  [facility] Pager [phone] If 7PM-7AM, please contact night-coverage at [email or web address], password [facility] 04/09/2015, 4:31 PM  LOS: 1 day

## 2015-04-09 NOTE — Progress Notes (Signed)
  [facility] Progress Note  Subjective    Denies fever and chills  Objective Filed Vitals:   04/08/15 2133 04/09/15 0449  BP: 159/55 150/57  Pulse: 71 73  Temp: 98.1 F (36.7 C) 99.5 F (37.5 C)  Resp: 20     Intake/Output Summary (Last 24 hours) at 04/09/15 0948 Last data filed at 04/09/15 0600  Gross per 24 hour  Intake    480 ml  Output   1875 ml  Net  -1395 ml    Right arm with mild erythema and swelling of upper portion of arm.  Palpable thrill in fistula Separation of distal incision. Serosanguinous drainage. No purulence seen.   Assessment/Planning: 70 y.o. male is s/p: 2nd stage right basilic vein transposition 03/14/15, now with wound infection.  Wound does not appear to have any purulent drainage. Right upper arm swelling and erythema. Continue abx, daily dressing changes and arm elevation.   Raymond Gurney 04/09/2015 9:48 AM --  Laboratory CBC    Component Value Date/Time   WBC 10.3 04/08/2015 0719   HGB 9.2* 04/08/2015 0719   HCT 28.2* 04/08/2015 0719   PLT 142* 04/08/2015 0719    BMET    Component Value Date/Time   NA 143 04/09/2015 0802   K 3.8 04/09/2015 0802   CL 105 04/09/2015 0802   CO2 26 04/09/2015 0802   GLUCOSE 101* 04/09/2015 0802   BUN 37* 04/09/2015 0802   CREATININE 4.41* 04/09/2015 0802   CALCIUM 8.6* 04/09/2015 0802   GFRNONAA 13* 04/09/2015 0802   GFRAA 15* 04/09/2015 0802    COAG Lab Results  Component Value Date   INR 1.14 03/14/2015   INR 0.97 01/03/2015   No results found for: PTT  Antibiotics Anti-infectives    Start     Dose/Rate Route Frequency Ordered Stop   04/08/15 1430  vancomycin (VANCOCIN) 2,250 mg in sodium chloride 0.9 % 500 mL IVPB     2,250 mg 250 mL/hr over 120 Minutes Intravenous  Once 04/08/15 1352 04/08/15 2112   04/08/15 1245  vancomycin (VANCOCIN) 2,250 mg in sodium chloride 0.9 % 500 mL IVPB  Status:  Discontinued     2,250 mg 250 mL/hr over 120 Minutes Intravenous   Once 04/08/15 1135 04/08/15 1352   04/08/15 0600  sulfamethoxazole-trimethoprim (BACTRIM DS,SEPTRA DS) 800-160 MG per tablet 2 tablet  Status:  Discontinued     2 tablet Oral Every 12 hours 04/08/15 0547 04/08/15 1031       Maris Berger, PA-C [facility] Office: [phone] Pager: [phone] 04/09/2015 9:48 AM

## 2015-04-10 LAB — BASIC METABOLIC PANEL
Anion gap: 14 (ref 5–15)
BUN: 42 mg/dL — ABNORMAL HIGH (ref 6–20)
CHLORIDE: 102 mmol/L (ref 101–111)
CO2: 26 mmol/L (ref 22–32)
CREATININE: 4.89 mg/dL — AB (ref 0.61–1.24)
Calcium: 8.7 mg/dL — ABNORMAL LOW (ref 8.9–10.3)
GFR, EST AFRICAN AMERICAN: 13 mL/min — AB (ref >60)
GFR, EST NON AFRICAN AMERICAN: 11 mL/min — AB (ref >60)
Glucose, Bld: 102 mg/dL — ABNORMAL HIGH (ref 65–99)
POTASSIUM: 4.1 mmol/L (ref 3.5–5.1)
SODIUM: 142 mmol/L (ref 135–145)

## 2015-04-10 LAB — URINE MICROSCOPIC-ADD ON

## 2015-04-10 LAB — URINALYSIS, ROUTINE W REFLEX MICROSCOPIC
Bilirubin Urine: NEGATIVE
Glucose, UA: NEGATIVE mg/dL
Ketones, ur: NEGATIVE mg/dL
Leukocytes, UA: NEGATIVE
Nitrite: NEGATIVE
Protein, ur: 100 mg/dL — AB
Specific Gravity, Urine: 1.015 (ref 1.005–1.030)
pH: 5.5 (ref 5.0–8.0)

## 2015-04-10 LAB — GLUCOSE, CAPILLARY
GLUCOSE-CAPILLARY: 92 mg/dL (ref 65–99)
GLUCOSE-CAPILLARY: 136 mg/dL — AB (ref 65–99)

## 2015-04-10 MED ORDER — DEXTROSE 5 % IV SOLN
1.0000 g | INTRAVENOUS | Status: DC
  Administered 2015-04-10 – 2015-04-13 (×4): 1 g via INTRAVENOUS
  Filled 2015-04-10 (×5): qty 10

## 2015-04-10 NOTE — Progress Notes (Signed)
[facility] PROGRESS NOTE  Derrick Howell XBJ:[id number] DOB: [birth year] DOA: 04/08/2015 PCP: No PCP Per Patient  70 year old male with ESRD not on dialysis with a recent right arm AV graft placed (one 03/14/2015 by Dr. Myra Gianotti), hypertension, A. fib on Eliquis, diabetes mellitus, history of CVA with residual left-sided weakness presented with pain and purulent drainage from the fistula site. Patient admitted for possible infection with abscess over the fistula site. Vascular surgery consulted.  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 Repeat blood culture done on admission.  - Pharmacy consulted for vancomycin administration. Will add rocephin - Vascular surgery following, plan is to continue antibiotics, daily dressing changes and arm elevation.   End-stage renal disease Not started on dialysis yet . Continue calcitriol, bicarbonate and bumetanide. Monitor renal function. Nephrology consult as needed. If serum creatinine bumps up will plan on consulting nephro will reassess next am. May be secondary to recent bactrim use - BMP next am.  A. fib  Continue metoprolol, Cardizem for rate control. Continue Eliquis  Essential hypertension On multiple blood pressure medications (hydralazine, amlodipine, Bumex, clonidine, Cardizem and metoprolol) all have been continued. Blood pressure stable on higher side.  Anemia of chronic disease Continue iron supplement  Insulin-dependent diabetes Continue home dose of glargine. Continue sliding scale coverage.  Left leg swelling Will monitor. Doubt DVT.   DVT prophylaxis: On eliquis   Diet: Renal/diabetic  Code Status: Full code Family Communication: None at bedside Disposition Plan: Continue inpatient monitoring.    Consultants:  Vascular surgery  Procedures:  Right upper extremity ultrasound  Antibiotics:  Oral Bactrim 1  IV  vancomycin since 2/14  HPI/Subjective: Pt has no new complaints, no acute issues overnight.  Objective: Filed Vitals:   04/10/15 0501 04/10/15 1000  BP: 166/60 143/82  Pulse: 79 79  Temp: 99.2 F (37.3 C) 99.7 F (37.6 C)  Resp: 24 20    Intake/Output Summary (Last 24 hours) at 04/10/15 1430 Last data filed at 04/10/15 1300  Gross per 24 hour  Intake    720 ml  Output    800 ml  Net    -80 ml   Filed Weights   04/08/15 0456 04/09/15 2107  Weight: 103.3 kg (227 lb 11.8 oz) 104 kg (229 lb 4.5 oz)    Exam:   General:  Elderly obese male lying in bed, alert and awake  HEENT: Moist mucosa, supple neck   Chest: Clear bilaterally, no wheezes  CVS: S1 and S2 irregular, no murmurs  GI: soft,  nondistended, nontender  Musculoskeletal: warm, induration over rt AV fistula site   Neuro: Sleepy but arousable and answering questions, chronic left-sided weakness  Data Reviewed: Basic Metabolic Panel:  Recent Labs Lab 04/08/15 0719 04/09/15 0802 04/10/15 0539  NA 143 143 142  K 3.9 3.8 4.1  CL 103 105 102  CO2 GLUCOSE 148* 101* 102*  BUN 37* 37* 42*  CREATININE 3.91* 4.41* 4.89*  CALCIUM 9.0 8.6* 8.7*   Liver Function Tests:  Recent Labs Lab 04/08/15 0719  AST 17  ALT 13*  ALKPHOS 75  BILITOT 0.5  PROT 6.4*  ALBUMIN 2.7*   No results for input(s): LIPASE, AMYLASE in the last 168 hours. No results for input(s): AMMONIA in the last 168 hours. CBC:  Recent Labs Lab 04/08/15 0719  WBC 10.3  NEUTROABS 7.9*  HGB 9.2*  HCT 28.2*  MCV 94.9  PLT 142*  Cardiac Enzymes: No results for input(s): CKTOTAL, CKMB, CKMBINDEX, TROPONINI in the last 168 hours. BNP (last 3 results) No results for input(s): BNP in the last 8760 hours.  ProBNP (last 3 results) No results for input(s): PROBNP in the last 8760 hours.  CBG:  Recent Labs Lab 04/08/15 2229 04/09/15 1620 04/09/15 2235 04/10/15 0754 04/10/15 1147  GLUCAP 141* 116* 130* 92 136*     Recent Results (from the past 240 hour(s))  Culture, blood (routine x 2)     Status: None (Preliminary result)   Collection Time: 04/08/15  7:29 AM  Result Value Ref Range Status   Specimen Description BLOOD LEFT ARM  Final   Special Requests BOTTLES DRAWN AEROBIC AND ANAEROBIC 5CC  Final   Culture NO GROWTH 2 DAYS  Final   Report Status PENDING  Incomplete  Culture, blood (routine x 2)     Status: None (Preliminary result)   Collection Time: 04/08/15  2:33 PM  Result Value Ref Range Status   Specimen Description BLOOD LEFT ARM  Final   Special Requests BOTTLES DRAWN AEROBIC ONLY 10 CC  Final   Culture NO GROWTH 2 DAYS  Final   Report Status PENDING  Incomplete  MRSA PCR Screening     Status: Abnormal   Collection Time: 04/08/15  9:26 PM  Result Value Ref Range Status   MRSA by PCR POSITIVE (A) NEGATIVE Final    Comment:        The GeneXpert MRSA Assay (FDA approved for NASAL specimens only), is one component of a comprehensive MRSA colonization surveillance program. It is not intended to diagnose MRSA infection nor to guide or monitor treatment for MRSA infections. RESULT CALLED TO, READ BACK BY AND VERIFIED WITH: C BOKIAGON,RN  04/09/15 MKELLY      Studies: Dg Chest Port 1 View  04/09/2015  CLINICAL DATA:  70 year old male with fever EXAM: PORTABLE CHEST 1 VIEW COMPARISON:  None. FINDINGS: Single portable view of the chest demonstrates clear lungs. There is no pleural effusion or pneumothorax. Top-normal cardiac size. No acute osseous pathology. IMPRESSION: No active disease. Electronically Signed   By: Elgie Collard M.D.   On: 04/09/2015 23:10    Scheduled Meds: . amLODipine  10 mg Oral Daily  . apixaban  2.5 mg Oral BID  . bumetanide  3 mg Oral BID  . calcitRIOL  0.25 mcg Oral Daily  . cefTRIAXone (ROCEPHIN)  IV  1 g Intravenous Q24H  . Chlorhexidine Gluconate Cloth  6 each Topical Q0600  . cholecalciferol  2,000 Units Oral Daily  . cloNIDine  0.2 mg  Oral TID  . diltiazem  240 mg Oral Daily  . docusate sodium  100 mg Oral BID  . ferrous sulfate  325 mg Oral TID WC  . hydrALAZINE  100 mg Oral 4 times per day  . insulin glargine  25 Units Subcutaneous QHS  . metoprolol  100 mg Oral BID  . mirtazapine  15 mg Oral QHS  . mupirocin ointment  1 application Nasal BID  . pramipexole  0.5 mg Oral QPM  . sodium bicarbonate  650 mg Oral BID   Continuous Infusions:     Time spent: 15 minutes    Penny Pia  [facility] Pager [phone] If 7PM-7AM, please contact night-coverage at [email or web address], password [facility] 04/10/2015, 2:30 PM  LOS: 2 days

## 2015-04-10 NOTE — Progress Notes (Signed)
Pharmacy Antibiotic Note  Derrick Howell is a 70 y.o. male admitted on 04/08/2015 with purulent cellulitis at site of his recent fistula graft.  Pharmacy has been consulted for Vancomycin and Ceftriaxone dosing.  VVS evaluated on 2/15. No purulence noted, serosanguinous drainage.   Tmax 103.1, WBC 10.2 on 2/14.     Received Vancomycin 2250 mg IV x 1 on 2/14 at ~7pm.  No standing Vanc regimen yet, but expect dosing interval of at least 48 hrs. BUN/creatinine trending up. On Bumex 3 mg PO BID as prior to admission.    Plan:   Adding Ceftriaxone 1 gm IV q24hrs today.   Random Vancomycin level planned for 2/17 am, to help guide further dosing.   Will re-dose when level is in 10-15 mcg/ml range    Target Vanc trough levels 10-15 mcg/ml   CBC and bmet in am.   Follow renal function, culture results, progress.     Height:  (170.2 cm) Weight: 229 lb 4.5 oz (104 kg) IBW/kg (Calculated) : 66.1  Temp (24hrs), Avg:100.8 F (38.2 C), Min:98.9 F (37.2 C), Max:103.1 F (39.5 C)   Recent Labs Lab 04/08/15 0719 04/09/15 0802 04/10/15 0539  WBC 10.3  --   --   CREATININE 3.91* 4.41* 4.89*    Estimated Creatinine Clearance: 16.6 mL/min (by C-G formula based on Cr of 4.89).    No Known Allergies  Antimicrobials this admission: Unasyn at ED in [place], [place] Oral Bactrim 2/14 x1 Vancomycin 2/14 >> CTX 2/16>> CHG/Bactroban 2/15>>(2/19)  Microbiology results: 2/14 BCx: ng x 2 days so far 2/14 BCx's were drawn at ED in [place], [place]: pending 2/14 MRSA screen POS   Dennie Fetters, [place] Pager: [phone] 04/10/2015 10:26 AM

## 2015-04-11 DIAGNOSIS — I482 Chronic atrial fibrillation: Secondary | ICD-10-CM

## 2015-04-11 DIAGNOSIS — L03113 Cellulitis of right upper limb: Secondary | ICD-10-CM

## 2015-04-11 DIAGNOSIS — D631 Anemia in chronic kidney disease: Secondary | ICD-10-CM

## 2015-04-11 DIAGNOSIS — E1121 Type 2 diabetes mellitus with diabetic nephropathy: Secondary | ICD-10-CM

## 2015-04-11 DIAGNOSIS — I120 Hypertensive chronic kidney disease with stage 5 chronic kidney disease or end stage renal disease: Secondary | ICD-10-CM

## 2015-04-11 DIAGNOSIS — T827XXD Infection and inflammatory reaction due to other cardiac and vascular devices, implants and grafts, subsequent encounter: Secondary | ICD-10-CM

## 2015-04-11 DIAGNOSIS — I69359 Hemiplegia and hemiparesis following cerebral infarction affecting unspecified side: Secondary | ICD-10-CM

## 2015-04-11 DIAGNOSIS — Z794 Long term (current) use of insulin: Secondary | ICD-10-CM

## 2015-04-11 LAB — BASIC METABOLIC PANEL
Anion gap: 13 (ref 5–15)
BUN: 44 mg/dL — AB (ref 6–20)
CALCIUM: 8.7 mg/dL — AB (ref 8.9–10.3)
CO2: 27 mmol/L (ref 22–32)
CREATININE: 5.01 mg/dL — AB (ref 0.61–1.24)
Chloride: 103 mmol/L (ref 101–111)
GFR calc Af Amer: 12 mL/min — ABNORMAL LOW (ref >60)
GFR, EST NON AFRICAN AMERICAN: 11 mL/min — AB (ref >60)
GLUCOSE: 85 mg/dL (ref 65–99)
Potassium: 3.6 mmol/L (ref 3.5–5.1)
Sodium: 143 mmol/L (ref 135–145)

## 2015-04-11 LAB — CBC
HEMATOCRIT: 28.5 % — AB (ref 39.0–52.0)
Hemoglobin: 9.4 g/dL — ABNORMAL LOW (ref 13.0–17.0)
MCH: 30.4 pg (ref 26.0–34.0)
MCHC: 33 g/dL (ref 30.0–36.0)
MCV: 92.2 fL (ref 78.0–100.0)
Platelets: 212 10*3/uL (ref 150–400)
RBC: 3.09 MIL/uL — ABNORMAL LOW (ref 4.22–5.81)
RDW: 14.2 % (ref 11.5–15.5)
WBC: 10.5 10*3/uL (ref 4.0–10.5)

## 2015-04-11 LAB — VANCOMYCIN, RANDOM: Vancomycin Rm: 13 ug/mL

## 2015-04-11 MED ORDER — VANCOMYCIN HCL 10 G IV SOLR
1500.0000 mg | INTRAVENOUS | Status: DC
  Administered 2015-04-11 – 2015-04-13 (×2): 1500 mg via INTRAVENOUS
  Filled 2015-04-11 (×2): qty 1500

## 2015-04-11 NOTE — Progress Notes (Signed)
[facility] PROGRESS NOTE  Derrick Howell NUU:[id number] DOB: 1945/04/18 DOA: 04/08/2015 PCP: No PCP Per Patient  70 year old male with ESRD not on dialysis with a recent right arm AV graft placed (one 03/14/2015 by Dr. Myra Gianotti), hypertension, A. fib on Eliquis, diabetes mellitus, history of CVA with residual left-sided weakness presented with pain and purulent drainage from the fistula site. Patient admitted for possible infection with abscess over the fistula site. Vascular surgery consulted.  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 Will follow rec's  End-stage renal disease -Not started on dialysis yet .  -Continue calcitriol, bicarbonate and bumetanide. -strict I's and O's and close follow of renal function -might need renal service consultation and if renal function declines further, temporary HD catheter and initiation of HD  A. fib  -Continue metoprolol, Cardizem for rate control.  -Continue Eliquis for now; patient might need to be swithc to coumadin if renal function continue declining -will monitor closely  Essential hypertension -On multiple blood pressure medications (hydralazine, amlodipine, Bumex, clonidine, Cardizem and metoprolol) all have been continued.  -Blood pressure fairly well ocntrolled  Anemia of chronic disease -Continue iron supplement -Epogen therapy as per renal discretion   Insulin-dependent diabetes -Continue home dose of glargine.  -Continue sliding scale coverage.  Left leg swelling -Will monitor. Doubt DVT, as patient chronically on Eliquis    DVT prophylaxis: On eliquis   Diet: Renal/diabetic  Code  Status: Full code Family Communication: None at bedside Disposition Plan: Continue inpatient monitoring. Continue current antibiotics; base on renal panel and I's and O's might need renal service consult    Consultants:  Vascular surgery  Procedures:  Right upper extremity ultrasound  Antibiotics:  Oral Bactrim 1  IV vancomycin since 2/14  HPI/Subjective: Pt is afebrile, denies CP and SOB. Reports some decrease in urine output; but not records appreciated.  Objective: Filed Vitals:   04/11/15 1656 04/11/15 2054  BP: 150/62 171/54  Pulse: 73 74  Temp: 98.4 F (36.9 C) 98.4 F (36.9 C)  Resp: 17 17    Intake/Output Summary (Last 24 hours) at 04/11/15 2202 Last data filed at 04/11/15 1818  Gross per 24 hour  Intake    720 ml  Output    425 ml  Net    295 ml   Filed Weights   04/08/15 0456 04/09/15 2107 04/11/15 2054  Weight: 103.3 kg (227 lb 11.8 oz) 104 kg (229 lb 4.5 oz) 98.1 kg (216 lb 4.3 oz)    Exam:   General:  Elderly obese male lying in bed, alert and awake; no fever and in no acute distress.  HEENT: Moist mucosa, supple neck, no JVD  Chest: Clear bilaterally, no wheezes  CVS: S1 and S2 irregular, no murmurs  GI: soft,  nondistended, nontender  Musculoskeletal: warm, induration over rt AV fistula site and mild serosanguineous discharge appreciated on his dressings  Neuro: oriented X 3, unchanged chronic left-sided weakness  Data Reviewed: Basic Metabolic Panel:  Recent Labs Lab 04/08/15 0719 04/09/15 0802 04/10/15 0539 04/11/15 0726  NA 143 143 142 143  K 3.9 3.8 4.1 3.6  CL 103 105 102 103  CO2 GLUCOSE 148* 101* 102* 85  BUN 37*  37* 42* 44*  CREATININE 3.91* 4.41* 4.89* 5.01*  CALCIUM 9.0 8.6* 8.7* 8.7*   Liver Function Tests:  Recent Labs Lab 04/08/15 0719  AST 17  ALT 13*  ALKPHOS 75  BILITOT 0.5  PROT 6.4*  ALBUMIN 2.7*   CBC:  Recent Labs Lab 04/08/15 0719 04/11/15 0726  WBC 10.3 10.5  NEUTROABS  7.9*  --   HGB 9.2* 9.4*  HCT 28.2* 28.5*  MCV 94.9 92.2  PLT 142* 212    ProBNP (last 3 results) No results for input(s): PROBNP in the last 8760 hours.  CBG:  Recent Labs Lab 04/08/15 2229 04/09/15 1620 04/09/15 2235 04/10/15 0754 04/10/15 1147  GLUCAP 141* 116* 130* 92 136*    Recent Results (from the past 240 hour(s))  Culture, blood (routine x 2)     Status: None (Preliminary result)   Collection Time: 04/08/15  7:29 AM  Result Value Ref Range Status   Specimen Description BLOOD LEFT ARM  Final   Special Requests BOTTLES DRAWN AEROBIC AND ANAEROBIC 5CC  Final   Culture NO GROWTH 3 DAYS  Final   Report Status PENDING  Incomplete  Culture, blood (routine x 2)     Status: None (Preliminary result)   Collection Time: 04/08/15  2:33 PM  Result Value Ref Range Status   Specimen Description BLOOD LEFT ARM  Final   Special Requests BOTTLES DRAWN AEROBIC ONLY 10 CC  Final   Culture NO GROWTH 3 DAYS  Final   Report Status PENDING  Incomplete  MRSA PCR Screening     Status: Abnormal   Collection Time: 04/08/15  9:26 PM  Result Value Ref Range Status   MRSA by PCR POSITIVE (A) NEGATIVE Final    Comment:        The GeneXpert MRSA Assay (FDA approved for NASAL specimens only), is one component of a comprehensive MRSA colonization surveillance program. It is not intended to diagnose MRSA infection nor to guide or monitor treatment for MRSA infections. RESULT CALLED TO, READ BACK BY AND VERIFIED WITH: C BOKIAGON,RN @0131  04/09/15 MKELLY      Studies: Dg Chest Port 1 View  04/09/2015  CLINICAL DATA:  70 year old male with fever EXAM: PORTABLE CHEST 1 VIEW COMPARISON:  None. FINDINGS: Single portable view of the chest demonstrates clear lungs. There is no pleural effusion or pneumothorax. Top-normal cardiac size. No acute osseous pathology. IMPRESSION: No active disease. Electronically Signed   By: Elgie Collard M.D.   On: 04/09/2015 23:10    Scheduled Meds: .  amLODipine  10 mg Oral Daily  . apixaban  2.5 mg Oral BID  . bumetanide  3 mg Oral BID  . calcitRIOL  0.25 mcg Oral Daily  . cefTRIAXone (ROCEPHIN)  IV  1 g Intravenous Q24H  . Chlorhexidine Gluconate Cloth  6 each Topical Q0600  . cholecalciferol  2,000 Units Oral Daily  . cloNIDine  0.2 mg Oral TID  . diltiazem  240 mg Oral Daily  . docusate sodium  100 mg Oral BID  . ferrous sulfate  325 mg Oral TID WC  . hydrALAZINE  100 mg Oral 4 times per day  . insulin glargine  25 Units Subcutaneous QHS  . metoprolol  100 mg Oral BID  . mirtazapine  15 mg Oral QHS  . mupirocin ointment  1 application Nasal BID  . pramipexole  0.5 mg Oral QPM  . sodium bicarbonate  650 mg Oral BID  . vancomycin  1,500 mg Intravenous Q48H  Continuous Infusions:     Time spent: 25 minutes    Vassie Loll  [facility] Pager [phone] If 7PM-7AM, please contact night-coverage at [email or web address], password [facility] 04/11/2015, 10:02 PM  LOS: 3 days

## 2015-04-11 NOTE — Care Management Important Message (Signed)
Important Message  Patient Details  Name: Derrick Howell MRN: [id number] Date of Birth: [birth year]   Medicare Important Message Given:  Yes    Marua Qin, Annamarie Major, RN 04/11/2015, 11:13 AM

## 2015-04-11 NOTE — Progress Notes (Signed)
Pharmacy Antibiotic Note  Derrick Howell is a 70 y.o. male admitted on 04/08/2015 with purulent cellulitis at site of his recent fistula graft.  Pharmacy has been consulted for Vancomycin and Ceftriaxone dosing.   VVS evaluated on 2/15. No purulence noted, serosanguinous drainage.   Tmax 102.3, WBC 10.5  Received Vancomycin 2250 mg IV x 1 on 2/14 at ~7pm.  Had expected dosing interval of at least 48 hrs. Random Vanc level this morning = 13 mcg/ml. At target trough level, so will re-dose today. BUN/creatinine trending up. On Bumex 3 mg PO BID as prior to admission.      Plan:   Begin Vancomycin 1500 mg IV q48hrs.   Continue Ceftriaxone 1gm IV q24hrs.   Target Vanc trough levels 10-15 mcg/ml   Will follow renal status for any need to modify Vanc regimen.   Follow renal function, culture results, progress.     Height:  (170.2 cm) Weight: 229 lb 4.5 oz (104 kg) IBW/kg (Calculated) : 66.1  Temp (24hrs), Avg:99.9 F (37.7 C), Min:98.1 F (36.7 C), Max:102.3 F (39.1 C)   Recent Labs Lab 04/08/15 0719 04/09/15 0802 04/10/15 0539 04/11/15 0726  WBC 10.3  --   --  10.5  CREATININE 3.91* 4.41* 4.89* 5.01*  VANCORANDOM  --   --   --  13    Estimated Creatinine Clearance: 16.2 mL/min (by C-G formula based on Cr of 5.01).    No Known Allergies  Antimicrobials this admission: Unasyn at ED in [place], [place] Oral Bactrim 2/14 x1 Vancomycin 2/14 >> CTX 2/16>> CHG/Bactroban 2/15>>(2/19)  Microbiology results: 2/14 BCx: ng x 2 days so far 2/14 BCx's were drawn at ED in [place], [place]: pending, but not in Care Everywhere 2/14 MRSA screen POS   Dennie Fetters, [place] Pager: [phone] 04/11/2015 10:33 AM

## 2015-04-12 DIAGNOSIS — E875 Hyperkalemia: Secondary | ICD-10-CM

## 2015-04-12 LAB — CBC
HCT: 30.8 % — ABNORMAL LOW (ref 39.0–52.0)
HEMOGLOBIN: 10 g/dL — AB (ref 13.0–17.0)
MCH: 29.4 pg (ref 26.0–34.0)
MCHC: 32.5 g/dL (ref 30.0–36.0)
MCV: 90.6 fL (ref 78.0–100.0)
PLATELETS: 247 10*3/uL (ref 150–400)
RBC: 3.4 MIL/uL — ABNORMAL LOW (ref 4.22–5.81)
RDW: 14.2 % (ref 11.5–15.5)
WBC: 11.6 10*3/uL — ABNORMAL HIGH (ref 4.0–10.5)

## 2015-04-12 LAB — RENAL FUNCTION PANEL
Albumin: 2.6 g/dL — ABNORMAL LOW (ref 3.5–5.0)
Anion gap: 19 — ABNORMAL HIGH (ref 5–15)
BUN: 48 mg/dL — ABNORMAL HIGH (ref 6–20)
CALCIUM: 9 mg/dL (ref 8.9–10.3)
CHLORIDE: 103 mmol/L (ref 101–111)
CO2: 21 mmol/L — AB (ref 22–32)
CREATININE: 4.75 mg/dL — AB (ref 0.61–1.24)
GFR calc Af Amer: 13 mL/min — ABNORMAL LOW (ref >60)
GFR calc non Af Amer: 11 mL/min — ABNORMAL LOW (ref >60)
GLUCOSE: 97 mg/dL (ref 65–99)
Phosphorus: 5 mg/dL — ABNORMAL HIGH (ref 2.5–4.6)
Potassium: 5.3 mmol/L — ABNORMAL HIGH (ref 3.5–5.1)
SODIUM: 143 mmol/L (ref 135–145)

## 2015-04-12 MED ORDER — SODIUM POLYSTYRENE SULFONATE 15 GM/60ML PO SUSP
15.0000 g | Freq: Once | ORAL | Status: AC
  Administered 2015-04-12: 15 g via ORAL
  Filled 2015-04-12: qty 60

## 2015-04-12 NOTE — Progress Notes (Signed)
[facility] PROGRESS NOTE  Derrick Howell WGN:[id number] DOB: 01-18-1946 DOA: 04/08/2015 PCP: No PCP Per Patient  70 year old male with ESRD not on dialysis with a recent right arm AV graft placed (one 03/14/2015 by Dr. Myra Gianotti), hypertension, A. fib on Eliquis, diabetes mellitus, history of CVA with residual left-sided weakness presented with pain and purulent drainage from the fistula site. Patient admitted for possible infection with abscess over the fistula site. Vascular surgery consulted.  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 Repeat blood culture done on admission.  - Pharmacy consulted for vancomycin administration. Will add rocephin - Vascular surgery following, plan is to continue antibiotics, daily dressing changes and arm elevation.   End-stage renal disease Not started on dialysis yet . Continue calcitriol, bicarbonate and bumetanide. Monitor renal function. Nephrology consult as needed. If serum creatinine bumps up will plan on consulting nephro will reassess next am. May be secondary to recent bactrim use - BMP next am.  A. fib  Continue metoprolol, Cardizem for rate control. Continue Eliquis  Essential hypertension On multiple blood pressure medications (hydralazine, amlodipine, Bumex, clonidine, Cardizem and metoprolol) all have been continued. Blood pressure stable on higher side.  Anemia of chronic disease Continue iron supplement  Insulin-dependent diabetes Continue home dose of glargine. Continue sliding scale coverage.  Left leg swelling Will monitor. Doubt DVT.  Hyperkalemia - Will administer Kayexalate  DVT prophylaxis: On eliquis   Diet: Renal/diabetic  Code Status: Full code Family Communication: None at bedside Disposition Plan: Continue inpatient monitoring.    Consultants:  Vascular surgery  Procedures:  Right upper extremity  ultrasound  Antibiotics:  Oral Bactrim 1  IV vancomycin since 2/14  HPI/Subjective: Pt has no new complaints, no acute issues overnight.  Objective: Filed Vitals:   04/12/15 0847 04/12/15 1434  BP: 170/69 162/60  Pulse: 86 70  Temp: 98.4 F (36.9 C) 98.4 F (36.9 C)  Resp: 18 18    Intake/Output Summary (Last 24 hours) at 04/12/15 1544 Last data filed at 04/12/15 1050  Gross per 24 hour  Intake    480 ml  Output   1200 ml  Net   -720 ml   Filed Weights   04/08/15 0456 04/09/15 2107 04/11/15 2054  Weight: 103.3 kg (227 lb 11.8 oz) 104 kg (229 lb 4.5 oz) 98.1 kg (216 lb 4.3 oz)    Exam:   General:  Elderly obese male lying in bed, alert and awake  HEENT: Moist mucosa, supple neck   Chest: Clear bilaterally, no wheezes  CVS: S1 and S2 irregular, no murmurs  GI: soft,  nondistended, nontender  Musculoskeletal: warm, induration over rt AV fistula site   Neuro: Sleepy but arousable and answering questions, chronic left-sided weakness  Data Reviewed: Basic Metabolic Panel:  Recent Labs Lab 04/08/15 0719 04/09/15 0802 04/10/15 0539 04/11/15 0726 04/12/15 0723  NA 143 143 142 143 143  K 3.9 3.8 4.1 3.6 5.3*  CL 103 105 102 103 103  CO2 21*  GLUCOSE 148* 101* 102* 85 97  BUN 37* 37* 42* 44* 48*  CREATININE 3.91* 4.41* 4.89* 5.01* 4.75*  CALCIUM 9.0 8.6* 8.7* 8.7* 9.0  PHOS  --   --   --   --  5.0*   Liver Function Tests:  Recent Labs Lab 04/08/15 0719 04/12/15 0723  AST 17  --   ALT 13*  --   ALKPHOS 75  --  BILITOT 0.5  --   PROT 6.4*  --   ALBUMIN 2.7* 2.6*   No results for input(s): LIPASE, AMYLASE in the last 168 hours. No results for input(s): AMMONIA in the last 168 hours. CBC:  Recent Labs Lab 04/08/15 0719 04/11/15 0726 04/12/15 0723  WBC 10.3 10.5 11.6*  NEUTROABS 7.9*  --   --   HGB 9.2* 9.4* 10.0*  HCT 28.2* 28.5* 30.8*  MCV 94.9 92.2 90.6  PLT 142* 212 247   Cardiac Enzymes: No results for input(s):  CKTOTAL, CKMB, CKMBINDEX, TROPONINI in the last 168 hours. BNP (last 3 results) No results for input(s): BNP in the last 8760 hours.  ProBNP (last 3 results) No results for input(s): PROBNP in the last 8760 hours.  CBG:  Recent Labs Lab 04/08/15 2229 04/09/15 1620 04/09/15 2235 04/10/15 0754 04/10/15 1147  GLUCAP 141* 116* 130* 92 136*    Recent Results (from the past 240 hour(s))  Culture, blood (routine x 2)     Status: None (Preliminary result)   Collection Time: 04/08/15  7:29 AM  Result Value Ref Range Status   Specimen Description BLOOD LEFT ARM  Final   Special Requests BOTTLES DRAWN AEROBIC AND ANAEROBIC 5CC  Final   Culture NO GROWTH 4 DAYS  Final   Report Status PENDING  Incomplete  Culture, blood (routine x 2)     Status: None (Preliminary result)   Collection Time: 04/08/15  2:33 PM  Result Value Ref Range Status   Specimen Description BLOOD LEFT ARM  Final   Special Requests BOTTLES DRAWN AEROBIC ONLY 10 CC  Final   Culture NO GROWTH 4 DAYS  Final   Report Status PENDING  Incomplete  MRSA PCR Screening     Status: Abnormal   Collection Time: 04/08/15  9:26 PM  Result Value Ref Range Status   MRSA by PCR POSITIVE (A) NEGATIVE Final    Comment:        The GeneXpert MRSA Assay (FDA approved for NASAL specimens only), is one component of a comprehensive MRSA colonization surveillance program. It is not intended to diagnose MRSA infection nor to guide or monitor treatment for MRSA infections. RESULT CALLED TO, READ BACK BY AND VERIFIED WITH: C BOKIAGON,RN  04/09/15 MKELLY      Studies: No results found.  Scheduled Meds: . amLODipine  10 mg Oral Daily  . apixaban  2.5 mg Oral BID  . bumetanide  3 mg Oral BID  . calcitRIOL  0.25 mcg Oral Daily  . cefTRIAXone (ROCEPHIN)  IV  1 g Intravenous Q24H  . Chlorhexidine Gluconate Cloth  6 each Topical Q0600  . cholecalciferol  2,000 Units Oral Daily  . cloNIDine  0.2 mg Oral TID  . diltiazem  240 mg  Oral Daily  . docusate sodium  100 mg Oral BID  . ferrous sulfate  325 mg Oral TID WC  . hydrALAZINE  100 mg Oral 4 times per day  . insulin glargine  25 Units Subcutaneous QHS  . metoprolol  100 mg Oral BID  . mirtazapine  15 mg Oral QHS  . mupirocin ointment  1 application Nasal BID  . pramipexole  0.5 mg Oral QPM  . sodium bicarbonate  650 mg Oral BID  . vancomycin  1,500 mg Intravenous Q48H   Continuous Infusions:     Time spent: 20 minutes    Penny Pia  [facility] Pager [phone] If 7PM-7AM, please contact night-coverage at [email or web address], password [facility] 04/12/2015, 3:44 PM  LOS: 4 days

## 2015-04-12 NOTE — Discharge Instructions (Signed)

## 2015-04-13 LAB — BASIC METABOLIC PANEL
Anion gap: 20 — ABNORMAL HIGH (ref 5–15)
BUN: 50 mg/dL — AB (ref 6–20)
CHLORIDE: 103 mmol/L (ref 101–111)
CO2: 19 mmol/L — AB (ref 22–32)
CREATININE: 4.53 mg/dL — AB (ref 0.61–1.24)
Calcium: 8.9 mg/dL (ref 8.9–10.3)
GFR calc non Af Amer: 12 mL/min — ABNORMAL LOW (ref >60)
GFR, EST AFRICAN AMERICAN: 14 mL/min — AB (ref >60)
Glucose, Bld: 106 mg/dL — ABNORMAL HIGH (ref 65–99)
POTASSIUM: 5.5 mmol/L — AB (ref 3.5–5.1)
Sodium: 142 mmol/L (ref 135–145)

## 2015-04-13 LAB — CULTURE, BLOOD (ROUTINE X 2)
CULTURE: NO GROWTH
Culture: NO GROWTH

## 2015-04-13 NOTE — Progress Notes (Signed)
[facility] PROGRESS NOTE  SHAHIR KAREN ONG:[id number] DOB: 1945-05-09 DOA: 04/08/2015 PCP: No PCP Per Patient  70 year old male with ESRD not on dialysis with a recent right arm AV graft placed (one 03/14/2015 by Dr. Myra Gianotti), hypertension, A. fib on Eliquis, diabetes mellitus, history of CVA with residual left-sided weakness presented with pain and purulent drainage from the fistula site. Patient admitted for possible infection with abscess over the fistula site. Vascular surgery consulted.  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 Repeat blood culture done on admission.  - Pharmacy consulted for vancomycin administration. Will add rocephin - Vascular surgery following, plan is to continue antibiotics, daily dressing changes and arm elevation.   End-stage renal disease Not started on dialysis yet . Continue calcitriol, bicarbonate and bumetanide. Monitor renal function.  - BMP next am.  A. fib  Continue metoprolol, Cardizem for rate control. Continue Eliquis  Essential hypertension On multiple blood pressure medications (hydralazine, amlodipine, Bumex, clonidine, Cardizem and metoprolol) all have been continued. Blood pressure stable on higher side.  Anemia of chronic disease Continue iron supplement  Insulin-dependent diabetes Continue home dose of glargine. Continue sliding scale coverage.  Left leg swelling Will monitor. Doubt DVT.  Hyperkalemia - Will administer Kayexalate  DVT prophylaxis: On eliquis   Diet: Renal/diabetic  Code Status: Full code Family Communication: None at bedside Disposition Plan: Continue inpatient monitoring.    Consultants:  Vascular surgery  Procedures:  Right upper extremity ultrasound  Antibiotics:  Oral Bactrim 1  IV vancomycin since 2/14  HPI/Subjective: Pt has no new complaints, no acute issues  overnight.  Objective: Filed Vitals:   04/13/15 0449 04/13/15 1000  BP: 147/44 151/55  Pulse: 76 77  Temp: 98.1 F (36.7 C) 98.2 F (36.8 C)  Resp: 18 18    Intake/Output Summary (Last 24 hours) at 04/13/15 1630 Last data filed at 04/13/15 1300  Gross per 24 hour  Intake    360 ml  Output      0 ml  Net    360 ml   Filed Weights   04/08/15 0456 04/09/15 2107 04/11/15 2054  Weight: 103.3 kg (227 lb 11.8 oz) 104 kg (229 lb 4.5 oz) 98.1 kg (216 lb 4.3 oz)    Exam:   General:  Elderly obese male lying in bed, alert and awake  HEENT: Moist mucosa, supple neck   Chest: Clear bilaterally, no wheezes  CVS: S1 and S2 irregular, no murmurs  GI: soft,  nondistended, nontender  Musculoskeletal: warm, induration over rt AV fistula site   Neuro: Sleepy but arousable and answering questions, chronic left-sided weakness  Data Reviewed: Basic Metabolic Panel:  Recent Labs Lab 04/08/15 0719 04/09/15 0802 04/10/15 0539 04/11/15 0726 04/12/15 0723  NA 143 143 142 143 143  K 3.9 3.8 4.1 3.6 5.3*  CL 103 105 102 103 103  CO2 21*  GLUCOSE 148* 101* 102* 85 97  BUN 37* 37* 42* 44* 48*  CREATININE 3.91* 4.41* 4.89* 5.01* 4.75*  CALCIUM 9.0 8.6* 8.7* 8.7* 9.0  PHOS  --   --   --   --  5.0*   Liver Function Tests:  Recent Labs Lab 04/08/15 0719 04/12/15 0723  AST 17  --   ALT 13*  --   ALKPHOS 75  --   BILITOT 0.5  --   PROT 6.4*  --   ALBUMIN 2.7* 2.6*   No results for  input(s): LIPASE, AMYLASE in the last 168 hours. No results for input(s): AMMONIA in the last 168 hours. CBC:  Recent Labs Lab 04/08/15 0719 04/11/15 0726 04/12/15 0723  WBC 10.3 10.5 11.6*  NEUTROABS 7.9*  --   --   HGB 9.2* 9.4* 10.0*  HCT 28.2* 28.5* 30.8*  MCV 94.9 92.2 90.6  PLT 142* 212 247   Cardiac Enzymes: No results for input(s): CKTOTAL, CKMB, CKMBINDEX, TROPONINI in the last 168 hours. BNP (last 3 results) No results for input(s): BNP in the last 8760  hours.  ProBNP (last 3 results) No results for input(s): PROBNP in the last 8760 hours.  CBG:  Recent Labs Lab 04/08/15 2229 04/09/15 1620 04/09/15 2235 04/10/15 0754 04/10/15 1147  GLUCAP 141* 116* 130* 92 136*    Recent Results (from the past 240 hour(s))  Culture, blood (routine x 2)     Status: None (Preliminary result)   Collection Time: 04/08/15  7:29 AM  Result Value Ref Range Status   Specimen Description BLOOD LEFT ARM  Final   Special Requests BOTTLES DRAWN AEROBIC AND ANAEROBIC 5CC  Final   Culture NO GROWTH 4 DAYS  Final   Report Status PENDING  Incomplete  Culture, blood (routine x 2)     Status: None (Preliminary result)   Collection Time: 04/08/15  2:33 PM  Result Value Ref Range Status   Specimen Description BLOOD LEFT ARM  Final   Special Requests BOTTLES DRAWN AEROBIC ONLY 10 CC  Final   Culture NO GROWTH 4 DAYS  Final   Report Status PENDING  Incomplete  MRSA PCR Screening     Status: Abnormal   Collection Time: 04/08/15  9:26 PM  Result Value Ref Range Status   MRSA by PCR POSITIVE (A) NEGATIVE Final    Comment:        The GeneXpert MRSA Assay (FDA approved for NASAL specimens only), is one component of a comprehensive MRSA colonization surveillance program. It is not intended to diagnose MRSA infection nor to guide or monitor treatment for MRSA infections. RESULT CALLED TO, READ BACK BY AND VERIFIED WITH: C BOKIAGON,RN  04/09/15 MKELLY      Studies: No results found.  Scheduled Meds: . amLODipine  10 mg Oral Daily  . apixaban  2.5 mg Oral BID  . bumetanide  3 mg Oral BID  . calcitRIOL  0.25 mcg Oral Daily  . cefTRIAXone (ROCEPHIN)  IV  1 g Intravenous Q24H  . Chlorhexidine Gluconate Cloth  6 each Topical Q0600  . cholecalciferol  2,000 Units Oral Daily  . cloNIDine  0.2 mg Oral TID  . diltiazem  240 mg Oral Daily  . docusate sodium  100 mg Oral BID  . ferrous sulfate  325 mg Oral TID WC  . hydrALAZINE  100 mg Oral 4 times per  day  . insulin glargine  25 Units Subcutaneous QHS  . metoprolol  100 mg Oral BID  . mirtazapine  15 mg Oral QHS  . pramipexole  0.5 mg Oral QPM  . sodium bicarbonate  650 mg Oral BID  . vancomycin  1,500 mg Intravenous Q48H   Continuous Infusions:     Time spent: 20 minutes    Penny Pia  [facility] Pager [phone] If 7PM-7AM, please contact night-coverage at [email or web address], password [email or web address] 04/13/2015, 4:30 PM  LOS: 5 days

## 2015-04-14 DIAGNOSIS — T827XXS Infection and inflammatory reaction due to other cardiac and vascular devices, implants and grafts, sequela: Secondary | ICD-10-CM

## 2015-04-14 LAB — BASIC METABOLIC PANEL
Anion gap: 14 (ref 5–15)
BUN: 45 mg/dL — ABNORMAL HIGH (ref 6–20)
CHLORIDE: 101 mmol/L (ref 101–111)
CO2: 27 mmol/L (ref 22–32)
CREATININE: 4.22 mg/dL — AB (ref 0.61–1.24)
Calcium: 8.8 mg/dL — ABNORMAL LOW (ref 8.9–10.3)
GFR calc non Af Amer: 13 mL/min — ABNORMAL LOW (ref >60)
GFR, EST AFRICAN AMERICAN: 15 mL/min — AB (ref >60)
Glucose, Bld: 103 mg/dL — ABNORMAL HIGH (ref 65–99)
Potassium: 3.2 mmol/L — ABNORMAL LOW (ref 3.5–5.1)
Sodium: 142 mmol/L (ref 135–145)

## 2015-04-14 LAB — CBC
HEMATOCRIT: 27.5 % — AB (ref 39.0–52.0)
HEMOGLOBIN: 8.9 g/dL — AB (ref 13.0–17.0)
MCH: 30.1 pg (ref 26.0–34.0)
MCHC: 32.4 g/dL (ref 30.0–36.0)
MCV: 92.9 fL (ref 78.0–100.0)
Platelets: 248 10*3/uL (ref 150–400)
RBC: 2.96 MIL/uL — ABNORMAL LOW (ref 4.22–5.81)
RDW: 14 % (ref 11.5–15.5)
WBC: 8 10*3/uL (ref 4.0–10.5)

## 2015-04-14 MED ORDER — DOXYCYCLINE HYCLATE 100 MG PO TABS: 100.0000 mg | ORAL_TABLET | Freq: Two times a day (BID) | ORAL | Status: DC

## 2015-04-14 MED ORDER — DOXYCYCLINE HYCLATE 100 MG PO TABS
100.0000 mg | ORAL_TABLET | Freq: Two times a day (BID) | ORAL | Status: DC
  Administered 2015-04-14 – 2015-04-15 (×3): 100 mg via ORAL
  Filled 2015-04-14 (×3): qty 1

## 2015-04-14 MED ORDER — VITAMIN D3 50 MCG (2000 UT) PO TABS: 1.0000 | ORAL_TABLET | Freq: Every day | ORAL | Status: AC

## 2015-04-14 NOTE — Discharge Summary (Addendum)
Physician Discharge Summary  Derrick Howell ZOX:[id number] DOB: February 11, 1946 DOA: 04/08/2015  PCP: No PCP Per Patient  Admit date: 04/08/2015 Discharge date: 04/14/2015  Time spent: > 35 minutes  Recommendations for Outpatient Follow-up:  1. Monitor K levels   Discharge Diagnoses:  Principal Problem:   Cellulitis Active Problems:   Chronic kidney disease (CKD), stage IV (severe) (HCC)   Obstructive chronic bronchitis without exacerbation (HCC)   Hypertension, renal   Atrial fibrillation (HCC)   Anemia of renal disease   Type 2 diabetes mellitus with diabetic nephropathy, with long-term current use of insulin (HCC)   History of hemorrhagic stroke with residual hemiparesis (HCC)   AV fistula infection (HCC)   Discharge Condition: stable  Diet recommendation: renal  Filed Weights   04/09/15 2107 04/11/15 2054 04/13/15 1957  Weight: 104 kg (229 lb 4.5 oz) 98.1 kg (216 lb 4.3 oz) 97.3 kg (214 lb 8.1 oz)    History of present illness:  From original HPI:  70 year old male with ESRD not on dialysis with a recent right arm AV graft placed (one 03/14/2015 by Dr. Myra Gianotti), hypertension, A. fib on Eliquis, diabetes mellitus, history of CVA with residual left-sided weakness presented with pain and purulent drainage from the fistula site. Patient admitted for possible infection with abscess over the fistula site. Vascular surgery consulted.  Hospital Course:  AV graft site infection with possible abscess Blood cultures drawn at [facility] with you first presented. Repeat blood culture done on admission.  - Pharmacy consulted for vancomycin administration. Will add rocephin - Vascular surgery following, plan is to continue oral antibiotics for another week, Continue bid wet to dry dressings and follow up with Dr. Myra Gianotti on 04/21/15   End-stage renal disease Not started on dialysis yet . Continue calcitriol, bicarbonate and bumetanide.  A. fib  Continue metoprolol, Cardizem for  rate control. Continue Eliquis  Essential hypertension On multiple blood pressure medications (hydralazine, amlodipine, Bumex, clonidine, Cardizem and metoprolol) all have been continued on d/c  Anemia of chronic disease Continue iron supplement  Insulin-dependent diabetes Continue home dose of glargine. Continue sliding scale coverage.  Hyperkalemia - resolved  DVT prophylaxis: On eliquis  Procedures:  None  Consultations:  None  Discharge Exam: Filed Vitals:   04/14/15 0859 04/14/15 1255  BP: 144/56 155/58  Pulse: 70   Temp: 98.7 F (37.1 C)   Resp: 18     General: Pt in nad, alert and awake Cardiovascular: no cyanosis Respiratory: cta bl, no wheezes  Discharge Instructions   Discharge Instructions    Call MD for:  redness, tenderness, or signs of infection (pain, swelling, redness, odor or green/yellow discharge around incision site)    Complete by:  As directed      Call MD for:  temperature >100.4    Complete by:  As directed      Diet - low sodium heart healthy    Complete by:  As directed      Discharge instructions    Complete by:  As directed   Please follow up with your primary care physician in 1-2 weeks or sooner should any new concerns arise.     Increase activity slowly    Complete by:  As directed           Current Discharge Medication List    START taking these medications   Details  doxycycline (VIBRA-TABS) 100 MG tablet Take 1 tablet (100 mg total) by mouth every 12 (twelve) hours. Qty: 14 tablet, Refills: 0  CONTINUE these medications which have CHANGED   Details  Cholecalciferol (VITAMIN D3) 2000 units TABS Take 1 tablet by mouth daily. Reported on 2015-04-11 Qty: 30 tablet, Refills: 0      CONTINUE these medications which have NOT CHANGED   Details  acetaminophen (TYLENOL) 500 MG tablet Take 500 mg by mouth every 4 (four) hours as needed.    alum & mag hydroxide-simeth (MAALOX/MYLANTA) 200-200-20 MG/5ML suspension Take  by mouth every 6 (six) hours as needed for indigestion or heartburn.    amLODipine (NORVASC) 10 MG tablet Take 10 mg by mouth daily.    apixaban (ELIQUIS) 2.5 MG TABS tablet Take 1 tablet (2.5 mg total) by mouth 2 (two) times daily. Qty: 60 tablet, Refills: 0    bumetanide (BUMEX) 1 MG tablet Take 3 mg by mouth 2 (two) times daily.     calcitRIOL (ROCALTROL) 0.25 MCG capsule Take 0.25 mcg by mouth daily.    cloNIDine (CATAPRES) 0.2 MG tablet Take 0.2 mg by mouth 3 (three) times daily.     diltiazem (DILACOR XR) 240 MG 24 hr capsule Take 240 mg by mouth daily.    docusate sodium (COLACE) 100 MG capsule Take 100 mg by mouth 2 (two) times daily.    ferrous sulfate 325 (65 FE) MG tablet Take 325 mg by mouth 3 (three) times daily with meals.     guaiFENesin (ROBITUSSIN) 100 MG/5ML liquid Take 200 mg by mouth 4 (four) times daily as needed for cough.    hydrALAZINE (APRESOLINE) 100 MG tablet Take 100 mg by mouth 4 (four) times daily.    insulin aspart (NOVOLOG) 100 UNIT/ML injection Inject 8 Units into the skin 3 (three) times daily after meals.     insulin glargine (LANTUS) 100 UNIT/ML injection Inject 25 Units into the skin at bedtime.     magnesium hydroxide (MILK OF MAGNESIA) 400 MG/5ML suspension Take 30 mLs by mouth at bedtime as needed for mild constipation.    Melatonin 5 MG TABS Take 1 tablet by mouth at bedtime. Reported on 04/11/2015    metoprolol (LOPRESSOR) 100 MG tablet Take 100 mg by mouth 2 (two) times daily.    mirtazapine (REMERON) 15 MG tablet Take 15 mg by mouth at bedtime.    oxyCODONE (ROXICODONE) 5 MG immediate release tablet Take 1 tablet (5 mg total) by mouth every 4 (four) hours as needed for severe pain. Qty: 30 tablet, Refills: 0    potassium chloride SA (K-DUR,KLOR-CON) 20 MEQ tablet Take 20 mEq by mouth daily.    pramipexole (MIRAPEX) 0.5 MG tablet Take 0.5 mg by mouth every evening.    sodium bicarbonate 650 MG tablet Take 650 mg by mouth 2 (two)  times daily.    traMADol (ULTRAM) 50 MG tablet Take 50 mg by mouth every 6 (six) hours as needed for moderate pain.    vitamin C (ASCORBIC ACID) 500 MG tablet Take 500 mg by mouth daily.      STOP taking these medications     dicyclomine (BENTYL) 20 MG tablet      loperamide (IMODIUM) 2 MG capsule      ondansetron (ZOFRAN ODT) 8 MG disintegrating tablet        No Known Allergies    The results of significant diagnostics from this hospitalization (including imaging, microbiology, ancillary and laboratory) are listed below for reference.    Significant Diagnostic Studies: [place] Extrem Up Right Ltd  2015-04-11  CLINICAL DATA:  Right arm AV fistula graft surgery 03/14/2015 with  right arm swelling, erythema and drainage. Evaluate for abscess. EXAM: ULTRASOUND RIGHT UPPER EXTREMITY LIMITED TECHNIQUE: Ultrasound examination of the upper extremity soft tissues was performed in the area of clinical concern. COMPARISON:  None. FINDINGS: Adjacent to the fistula in the right arm and is a complex presumed fluid collection measuring up to 5.5 x 2.6 cm transverse and extending nearly 19 cm in length. This collection demonstrates heterogeneous echogenic components and a single area of blood flow which may reflect an underlying normal blood vessel. The graft appears patent. No pseudoaneurysm identified. Complete vascular assessment of the graft not performed. IMPRESSION: Complex, presumed fluid collection adjacent to the right arm graft may reflect a large hematoma or abscess. Correlate clinically. This collection could be sampled under ultrasound if clinically warranted but does not appear drainable. CT angiogram could be performed if there is concern of leak or pseudoaneurysm, not demonstrated by this exam. Electronically Signed   By: Carey Bullocks M.D.   On: 04/08/2015 11:15   Dg Chest Port 1 View  04/09/2015  CLINICAL DATA:  70 year old male with fever EXAM: PORTABLE CHEST 1 VIEW COMPARISON:  None.  FINDINGS: Single portable view of the chest demonstrates clear lungs. There is no pleural effusion or pneumothorax. Top-normal cardiac size. No acute osseous pathology. IMPRESSION: No active disease. Electronically Signed   By: Elgie Collard M.D.   On: 04/09/2015 23:10    Microbiology: Recent Results (from the past 240 hour(s))  Culture, blood (routine x 2)     Status: None   Collection Time: 04/08/15  7:29 AM  Result Value Ref Range Status   Specimen Description BLOOD LEFT ARM  Final   Special Requests BOTTLES DRAWN AEROBIC AND ANAEROBIC 5CC  Final   Culture NO GROWTH 5 DAYS  Final   Report Status 04/13/2015 FINAL  Final  Culture, blood (routine x 2)     Status: None   Collection Time: 04/08/15  2:33 PM  Result Value Ref Range Status   Specimen Description BLOOD LEFT ARM  Final   Special Requests BOTTLES DRAWN AEROBIC ONLY 10 CC  Final   Culture NO GROWTH 5 DAYS  Final   Report Status 04/13/2015 FINAL  Final  MRSA PCR Screening     Status: Abnormal   Collection Time: 04/08/15  9:26 PM  Result Value Ref Range Status   MRSA by PCR POSITIVE (A) NEGATIVE Final    Comment:        The GeneXpert MRSA Assay (FDA approved for NASAL specimens only), is one component of a comprehensive MRSA colonization surveillance program. It is not intended to diagnose MRSA infection nor to guide or monitor treatment for MRSA infections. RESULT CALLED TO, READ BACK BY AND VERIFIED WITH: C BOKIAGON,RN @0131  04/09/15 MKELLY      Labs: Basic Metabolic Panel:  Recent Labs Lab 04/10/15 0539 04/11/15 0726 04/12/15 0723 04/13/15 1702 04/14/15 0709  NA 142 143 143 142 142  K 4.1 3.6 5.3* 5.5* 3.2*  CL 102 103 103 103 101  CO2 26 27 21* 19* 27  GLUCOSE 102* 85 97 106* 103*  BUN 42* 44* 48* 50* 45*  CREATININE 4.89* 5.01* 4.75* 4.53* 4.22*  CALCIUM 8.7* 8.7* 9.0 8.9 8.8*  PHOS  --   --  5.0*  --   --    Liver Function Tests:  Recent Labs Lab 04/08/15 0719 04/12/15 0723  AST 17  --    ALT 13*  --   ALKPHOS 75  --   BILITOT 0.5  --  PROT 6.4*  --   ALBUMIN 2.7* 2.6*   No results for input(s): LIPASE, AMYLASE in the last 168 hours. No results for input(s): AMMONIA in the last 168 hours. CBC:  Recent Labs Lab 04/08/15 0719 04/11/15 0726 04/12/15 0723 04/14/15 0709  WBC 10.3 10.5 11.6* 8.0  NEUTROABS 7.9*  --   --   --   HGB 9.2* 9.4* 10.0* 8.9*  HCT 28.2* 28.5* 30.8* 27.5*  MCV 94.9 92.2 90.6 92.9  PLT 142* 212 247 248   Cardiac Enzymes: No results for input(s): CKTOTAL, CKMB, CKMBINDEX, TROPONINI in the last 168 hours. BNP: BNP (last 3 results) No results for input(s): BNP in the last 8760 hours.  ProBNP (last 3 results) No results for input(s): PROBNP in the last 8760 hours.  CBG:  Recent Labs Lab 04/08/15 2229 04/09/15 1620 04/09/15 2235 04/10/15 0754 04/10/15 1147  GLUCAP 141* 116* 130* 92 136*       Signed:  Penny Pia MD.  [facility] 04/14/2015, 2:44 PM    Ok to discharge to ALF from a medical standpoint. 04/15/15  Penny Pia

## 2015-04-14 NOTE — NC FL2 (Signed)
[place] MEDICAID FL2 LEVEL OF CARE SCREENING TOOL     IDENTIFICATION  Patient Name: Derrick Howell Birthdate: 1945/03/23 Sex: male Admission Date (Current Location): 04/08/2015  [facility] and [place] Number:  Recruitment consultant and Address:  The . [facility], [address] [place], [place] [address]      Provider Number: [id number]  Attending Physician Name and Address:  Penny Pia, MD  Relative Name and Phone Number:       Current Level of Care: Hospital Recommended Level of Care: Assisted Living Facility Prior Approval Number:    Date Approved/Denied:   PASRR Number:    Discharge Plan:  (ALF)    Current Diagnoses: Patient Active Problem List   Diagnosis Date Noted  . Obstructive chronic bronchitis without exacerbation (HCC) 04/08/2015  . Hypertension, renal 04/08/2015  . Atrial fibrillation (HCC) 04/08/2015  . Anemia of renal disease 04/08/2015  . Type 2 diabetes mellitus with diabetic nephropathy, with long-term current use of insulin (HCC) 04/08/2015  . History of hemorrhagic stroke with residual hemiparesis (HCC) 04/08/2015  . Cellulitis 04/08/2015  . AV fistula infection (HCC)   . Chronic kidney disease (CKD), stage IV (severe) (HCC) 12/23/2014    Orientation RESPIRATION BLADDER Height & Weight     Self, Time, Situation, Place  Normal Continent Weight: 214 lb 8.1 oz (97.3 kg) Height:   (170.2 cm)  BEHAVIORAL SYMPTOMS/MOOD NEUROLOGICAL BOWEL NUTRITION STATUS      Continent Diet (low sodium heart healthy)  AMBULATORY STATUS COMMUNICATION OF NEEDS Skin   Independent (uses cane) Verbally Other (Comment) (RU arm abscess)                       Personal Care Assistance Level of Assistance  Bathing, Dressing Bathing Assistance: Limited assistance   Dressing Assistance: Limited assistance     Functional Limitations Info             SPECIAL CARE FACTORS FREQUENCY                       Contractures  Contractures Info: Not present    Additional Factors Info  Insulin Sliding Scale       Insulin Sliding Scale Info:  (3x daily with meals)       Current Medications (04/14/2015):  This is the current hospital active medication list Current Facility-Administered Medications  Medication Dose Route Frequency Provider Last Rate Last Dose  . acetaminophen (TYLENOL) tablet 650 mg  650 mg Oral Q4H PRN Leanne Chang, NP   650 mg at 04/11/15 0951  . amLODipine (NORVASC) tablet 10 mg  10 mg Oral Daily Alberteen Sam, MD   10 mg at 04/14/15 0836  . apixaban (ELIQUIS) tablet 2.5 mg  2.5 mg Oral BID Alberteen Sam, MD   2.5 mg at 04/14/15 0837  . bumetanide (BUMEX) tablet 3 mg  3 mg Oral BID Alberteen Sam, MD   3 mg at 04/14/15 1846  . calcitRIOL (ROCALTROL) capsule 0.25 mcg  0.25 mcg Oral Daily Alberteen Sam, MD   0.25 mcg at 04/14/15 0835  . cholecalciferol (VITAMIN D) tablet 2,000 Units  2,000 Units Oral Daily Alberteen Sam, MD   2,000 Units at 04/14/15 [phone]  . cloNIDine (CATAPRES) tablet 0.2 mg  0.2 mg Oral TID Alberteen Sam, MD   0.2 mg at 04/14/15 1630  . diltiazem (CARDIZEM CD) 24 hr capsule 240 mg  240 mg  Oral Daily Alberteen Sam, MD   240 mg at 04/14/15 2956  . docusate sodium (COLACE) capsule 100 mg  100 mg Oral BID Alberteen Sam, MD   100 mg at 04/14/15 0835  . doxycycline (VIBRA-TABS) tablet 100 mg  100 mg Oral Q12H Penny Pia, MD   100 mg at 04/14/15 1254  . ferrous sulfate tablet 325 mg  325 mg Oral TID WC Alberteen Sam, MD   325 mg at 04/14/15 1630  . hydrALAZINE (APRESOLINE) tablet 100 mg  100 mg Oral 4 times per day Alberteen Sam, MD   100 mg at 04/14/15 1847  . insulin glargine (LANTUS) injection 25 Units  25 Units Subcutaneous QHS Alberteen Sam, MD   25 Units at 04/13/15 2140  . metoprolol (LOPRESSOR) tablet 100 mg  100 mg Oral BID Alberteen Sam, MD   100 mg at 04/14/15 0837  .  mirtazapine (REMERON) tablet 15 mg  15 mg Oral QHS Alberteen Sam, MD   15 mg at 04/13/15 2140  . oxyCODONE (Oxy IR/ROXICODONE) immediate release tablet 5 mg  5 mg Oral Q4H PRN Alberteen Sam, MD   5 mg at 04/12/15 2208  . pramipexole (MIRAPEX) tablet 0.5 mg  0.5 mg Oral QPM Alberteen Sam, MD   0.5 mg at 04/14/15 1846  . sodium bicarbonate tablet 650 mg  650 mg Oral BID Alberteen Sam, MD   650 mg at 04/14/15 2130     Discharge Medications: Please see discharge summary for a list of discharge medications.  Relevant Imaging Results:  Relevant Lab Results:   Additional Information wet to dry dressings BID to AV fistula site  Davidson Palmieri M, LCSW

## 2015-04-14 NOTE — Progress Notes (Signed)
  [facility] Progress Note  Subjective  - No complaints.   Objective Filed Vitals:   04/13/15 1957 04/14/15 0542  BP: 158/60 178/64  Pulse: 70 64  Temp: 99.1 F (37.3 C) 98.2 F (36.8 C)  Resp: 16 18    Intake/Output Summary (Last 24 hours) at 04/14/15 0917 Last data filed at 04/14/15 0543  Gross per 24 hour  Intake   1150 ml  Output   1850 ml  Net   -700 ml   Right upper arm fistula with palpable thrill. Distal incision open with serous drainage and good granulation tissue. No purulence seen.   Assessment/Planning: 70 y.o. male is s/p: 2nd stage right BVT and RUE cellulitis.   Right upper arm wound appears clean.  Blood cultures negative.  Ok to d/c from vascular standpoint. Continue bid wet to dry dressings and po abx for one week. Follow-up with Dr. Myra Gianotti in on 04/21/15.   Raymond Gurney 04/14/2015 9:17 AM --  Laboratory CBC    Component Value Date/Time   WBC 8.0 04/14/2015 0709   HGB 8.9* 04/14/2015 0709   HCT 27.5* 04/14/2015 0709   PLT 248 04/14/2015 0709    BMET    Component Value Date/Time   NA 142 04/14/2015 0709   K 3.2* 04/14/2015 0709   CL 101 04/14/2015 0709   CO2 27 04/14/2015 0709   GLUCOSE 103* 04/14/2015 0709   BUN 45* 04/14/2015 0709   CREATININE 4.22* 04/14/2015 0709   CALCIUM 8.8* 04/14/2015 0709   GFRNONAA 13* 04/14/2015 0709   GFRAA 15* 04/14/2015 0709    COAG Lab Results  Component Value Date   INR 1.14 03/14/2015   INR 0.97 01/03/2015   No results found for: PTT  Antibiotics Anti-infectives    Start     Dose/Rate Route Frequency Ordered Stop   04/11/15 1200  vancomycin (VANCOCIN) 1,500 mg in sodium chloride 0.9 % 500 mL IVPB     1,500 mg 250 mL/hr over 120 Minutes Intravenous Every 48 hours 04/11/15 1030     04/10/15 1100  cefTRIAXone (ROCEPHIN) 1 g in dextrose 5 % 50 mL IVPB     1 g 100 mL/hr over 30 Minutes Intravenous Every 24 hours 04/10/15 1006     04/08/15 1430  vancomycin (VANCOCIN) 2,250  mg in sodium chloride 0.9 % 500 mL IVPB     2,250 mg 250 mL/hr over 120 Minutes Intravenous  Once 04/08/15 1352 04/08/15 2112   04/08/15 1245  vancomycin (VANCOCIN) 2,250 mg in sodium chloride 0.9 % 500 mL IVPB  Status:  Discontinued     2,250 mg 250 mL/hr over 120 Minutes Intravenous  Once 04/08/15 1135 04/08/15 1352   04/08/15 0600  sulfamethoxazole-trimethoprim (BACTRIM DS,SEPTRA DS) 800-160 MG per tablet 2 tablet  Status:  Discontinued     2 tablet Oral Every 12 hours 04/08/15 0547 04/08/15 1031       Maris Berger, PA-C [facility] Office: [phone] Pager: [phone] 04/14/2015 9:17 AM

## 2015-04-14 NOTE — Care Management Important Message (Signed)
Important Message  Patient Details  Name: CASWELL ALVILLAR MRN: [id number] Date of Birth: Aug 31, 1945   Medicare Important Message Given:  Yes    Emira Eubanks, Annamarie Major, RN 04/14/2015, 10:57 AM

## 2015-04-14 NOTE — Progress Notes (Signed)
CSW spoke with pt's ALF [facility]) re: pt's d/c.  Per the facility, they will need to assess pt prior to his return.  Facility staff to complete assessment on 04/15/15.  Unit CSW will follow and facilitate d/c as appropriate.  Pollyann Savoy, LCSW Evening/ED Coverage [phone]

## 2015-04-15 LAB — BASIC METABOLIC PANEL
ANION GAP: 14 (ref 5–15)
BUN: 45 mg/dL — ABNORMAL HIGH (ref 6–20)
CHLORIDE: 104 mmol/L (ref 101–111)
CO2: 25 mmol/L (ref 22–32)
CREATININE: 4.1 mg/dL — AB (ref 0.61–1.24)
Calcium: 9.2 mg/dL (ref 8.9–10.3)
GFR calc non Af Amer: 14 mL/min — ABNORMAL LOW (ref >60)
GFR, EST AFRICAN AMERICAN: 16 mL/min — AB (ref >60)
Glucose, Bld: 110 mg/dL — ABNORMAL HIGH (ref 65–99)
POTASSIUM: 3.4 mmol/L — AB (ref 3.5–5.1)
SODIUM: 143 mmol/L (ref 135–145)

## 2015-04-15 NOTE — Progress Notes (Signed)
Derrick Howell to be D/C'd Skilled nursing facility (Assisted living facility) per MD order.     Medication List    STOP taking these medications        dicyclomine 20 MG tablet  Commonly known as:  BENTYL     loperamide 2 MG capsule  Commonly known as:  IMODIUM     ondansetron 8 MG disintegrating tablet  Commonly known as:  ZOFRAN ODT      TAKE these medications        acetaminophen 500 MG tablet  Commonly known as:  TYLENOL  Take 500 mg by mouth every 4 (four) hours as needed.     alum & mag hydroxide-simeth 200-200-20 MG/5ML suspension  Commonly known as:  MAALOX/MYLANTA  Take by mouth every 6 (six) hours as needed for indigestion or heartburn.     amLODipine 10 MG tablet  Commonly known as:  NORVASC  Take 10 mg by mouth daily.     apixaban 2.5 MG Tabs tablet  Commonly known as:  ELIQUIS  Take 1 tablet (2.5 mg total) by mouth 2 (two) times daily.     bumetanide 1 MG tablet  Commonly known as:  BUMEX  Take 3 mg by mouth 2 (two) times daily.     calcitRIOL 0.25 MCG capsule  Commonly known as:  ROCALTROL  Take 0.25 mcg by mouth daily.     cloNIDine 0.2 MG tablet  Commonly known as:  CATAPRES  Take 0.2 mg by mouth 3 (three) times daily.     diltiazem 240 MG 24 hr capsule  Commonly known as:  DILACOR XR  Take 240 mg by mouth daily.     docusate sodium 100 MG capsule  Commonly known as:  COLACE  Take 100 mg by mouth 2 (two) times daily.     doxycycline 100 MG tablet  Commonly known as:  VIBRA-TABS  Take 1 tablet (100 mg total) by mouth every 12 (twelve) hours.     ferrous sulfate 325 (65 FE) MG tablet  Take 325 mg by mouth 3 (three) times daily with meals.     guaiFENesin 100 MG/5ML liquid  Commonly known as:  ROBITUSSIN  Take 200 mg by mouth 4 (four) times daily as needed for cough.     hydrALAZINE 100 MG tablet  Commonly known as:  APRESOLINE  Take 100 mg by mouth 4 (four) times daily.     insulin aspart 100 UNIT/ML injection  Commonly known as:   novoLOG  Inject 8 Units into the skin 3 (three) times daily after meals.     insulin glargine 100 UNIT/ML injection  Commonly known as:  LANTUS  Inject 25 Units into the skin at bedtime.     magnesium hydroxide 400 MG/5ML suspension  Commonly known as:  MILK OF MAGNESIA  Take 30 mLs by mouth at bedtime as needed for mild constipation.     Melatonin 5 MG Tabs  Take 1 tablet by mouth at bedtime. Reported on 04/08/2015     metoprolol 100 MG tablet  Commonly known as:  LOPRESSOR  Take 100 mg by mouth 2 (two) times daily.     mirtazapine 15 MG tablet  Commonly known as:  REMERON  Take 15 mg by mouth at bedtime.     oxyCODONE 5 MG immediate release tablet  Commonly known as:  ROXICODONE  Take 1 tablet (5 mg total) by mouth every 4 (four) hours as needed for severe pain.     potassium chloride  SA 20 MEQ tablet  Commonly known as:  K-DUR,KLOR-CON  Take 20 mEq by mouth daily.     pramipexole 0.5 MG tablet  Commonly known as:  MIRAPEX  Take 0.5 mg by mouth every evening.     sodium bicarbonate 650 MG tablet  Take 650 mg by mouth 2 (two) times daily.     traMADol 50 MG tablet  Commonly known as:  ULTRAM  Take 50 mg by mouth every 6 (six) hours as needed for moderate pain.     vitamin C 500 MG tablet  Commonly known as:  ASCORBIC ACID  Take 500 mg by mouth daily.     Vitamin D3 2000 units Tabs  Take 1 tablet by mouth daily. Reported on 04/08/2015        Filed Vitals:   04/15/15 0517 04/15/15 1000  BP: 164/63 151/62  Pulse: 69 77  Temp: 98.5 F (36.9 C) 98 F (36.7 C)  Resp: 20 18    Skin clean, dry and intact without evidence of skin break down, no evidence of skin tears noted. IV catheter discontinued intact.  An After Visit Summary was printed and given to the patient. Patient escorted via WC, and D/C to ALF via facility transport.   Jonell Cluck RN [facility] Phone [phone]

## 2015-04-15 NOTE — Clinical Social Work Note (Signed)
Patient to be d/c'ed today back to [facility]F.  Patient and family agreeable to plans will transport via ALF transportation services.  Windell Moulding, MSW, [facility]ors [phone]

## 2015-04-15 NOTE — Clinical Social Work Note (Signed)
CSW contacted [name] to find out when they will be assessing patient.  Caswell House said she will assess today, and inform CSW.  CSW to continue following patient's progress.  Derrick Howell. Krishika Bugge, MSW, [facility] [phone] 04/15/2015 11:47 AM

## 2015-04-16 ENCOUNTER — Encounter: Payer: Self-pay | Admitting: Surgery

## 2015-04-21 ENCOUNTER — Ambulatory Visit (INDEPENDENT_AMBULATORY_CARE_PROVIDER_SITE_OTHER): Payer: Medicare Other | Admitting: Surgery

## 2015-04-21 ENCOUNTER — Encounter: Payer: Self-pay | Admitting: Surgery

## 2015-04-21 VITALS — BP 189/90 | HR 71 | Temp 97.0°F | Resp 16 | Ht 67.0 in | Wt 211.0 lb

## 2015-04-21 DIAGNOSIS — N184 Chronic kidney disease, stage 4 (severe): Secondary | ICD-10-CM

## 2015-04-21 NOTE — Progress Notes (Signed)
Filed Vitals:   04/21/15 1002 04/21/15 1006  BP: 180/93 189/90  Pulse: 76 71  Temp: 97 F (36.1 C)   TempSrc: Oral   Resp: 16   Height:  (1.702 m)   Weight: 211 lb (95.709 kg)   SpO2: 100%

## 2015-04-21 NOTE — Progress Notes (Signed)
Patient name: Derrick Howell MRN: [id number] DOB: [birth year] Sex: male     Chief Complaint  Patient presents with  . Routine Post Op    Right BVT  03-14-15    f/u      HISTORY OF PRESENT ILLNESS:  the patient is back for follow-up.  On 03/15/2015 he underwent a right second stage basilic vein transposition. I did have to divide the vein and performed a end to end anastomosis. He is not yet on dialysis.  He has no symptoms of steal.  Past Medical History  Diagnosis Date  . Hyperlipidemia   . Hypertension   . COPD (chronic obstructive pulmonary disease) (HCC)   . Stroke (HCC)   . Atrial fibrillation (HCC)   . Depression   . OSA (obstructive sleep apnea)   . Diabetes mellitus without complication (HCC)   . CKD (chronic kidney disease)   . Peripheral vascular disease (HCC)     Left side    Past Surgical History  Procedure Laterality Date  . Av fistula placement Right 01/03/2015    Procedure: ARTERIOVENOUS (AV) FISTULA CREATION;  Surgeon: Nada Libman, MD;  Location: [facility];  Service: Vascular;  Laterality: Right;  . Bascilic vein transposition Right 03/14/2015    Procedure: Right Arm SECOND STAGE BASILIC VEIN TRANSPOSITION;  Surgeon: Nada Libman, MD;  Location: [facility] OR;  Service: Vascular;  Laterality: Right;    Social History   Social History  . Marital Status: Legally Separated    Spouse Name: N/A  . Number of Children: N/A  . Years of Education: N/A   Occupational History  . Not on file.   Social History Main Topics  . Smoking status: Light Tobacco Smoker -- 0.25 packs/day    Types: Cigarettes  . Smokeless tobacco: Never Used  . Alcohol Use: No  . Drug Use: No  . Sexual Activity: Not on file   Other Topics Concern  . Not on file   Social History Narrative    Family History  Problem Relation Age of Onset  . Stroke Mother   . Diabetes Mother     Allergies as of 04/21/2015  . (No Known Allergies)    Current Outpatient Prescriptions on File  Prior to Visit  Medication Sig Dispense Refill  . acetaminophen (TYLENOL) 500 MG tablet Take 500 mg by mouth every 4 (four) hours as needed.    Marland Kitchen alum & mag hydroxide-simeth (MAALOX/MYLANTA) 200-200-20 MG/5ML suspension Take by mouth every 6 (six) hours as needed for indigestion or heartburn.    Marland Kitchen amLODipine (NORVASC) 10 MG tablet Take 10 mg by mouth daily.    Marland Kitchen apixaban (ELIQUIS) 2.5 MG TABS tablet Take 1 tablet (2.5 mg total) by mouth 2 (two) times daily. 60 tablet 0  . bumetanide (BUMEX) 1 MG tablet Take 3 mg by mouth 2 (two) times daily.     . calcitRIOL (ROCALTROL) 0.25 MCG capsule Take 0.25 mcg by mouth daily.    . Cholecalciferol (VITAMIN D3) 2000 units TABS Take 1 tablet by mouth daily. Reported on 04/08/2015 30 tablet 0  . cloNIDine (CATAPRES) 0.2 MG tablet Take 0.2 mg by mouth 3 (three) times daily.     Marland Kitchen diltiazem (DILACOR XR) 240 MG 24 hr capsule Take 240 mg by mouth daily.    Marland Kitchen docusate sodium (COLACE) 100 MG capsule Take 100 mg by mouth 2 (two) times daily.    Marland Kitchen doxycycline (VIBRA-TABS) 100 MG tablet Take 1 tablet (100 mg total)  by mouth every 12 (twelve) hours. 14 tablet 0  . ferrous sulfate 325 (65 FE) MG tablet Take 325 mg by mouth 3 (three) times daily with meals.     Marland Kitchen guaiFENesin (ROBITUSSIN) 100 MG/5ML liquid Take 200 mg by mouth 4 (four) times daily as needed for cough.    . hydrALAZINE (APRESOLINE) 100 MG tablet Take 100 mg by mouth 4 (four) times daily.    . insulin aspart (NOVOLOG) 100 UNIT/ML injection Inject 8 Units into the skin 3 (three) times daily after meals.     . insulin glargine (LANTUS) 100 UNIT/ML injection Inject 25 Units into the skin at bedtime.     . magnesium hydroxide (MILK OF MAGNESIA) 400 MG/5ML suspension Take 30 mLs by mouth at bedtime as needed for mild constipation.    . Melatonin 5 MG TABS Take 1 tablet by mouth at bedtime. Reported on 04/08/2015    . metoprolol (LOPRESSOR) 100 MG tablet Take 100 mg by mouth 2 (two) times daily.    . mirtazapine  (REMERON) 15 MG tablet Take 15 mg by mouth at bedtime.    Marland Kitchen oxyCODONE (ROXICODONE) 5 MG immediate release tablet Take 1 tablet (5 mg total) by mouth every 4 (four) hours as needed for severe pain. 30 tablet 0  . potassium chloride SA (K-DUR,KLOR-CON) 20 MEQ tablet Take 20 mEq by mouth daily.    . pramipexole (MIRAPEX) 0.5 MG tablet Take 0.5 mg by mouth every evening.    . sodium bicarbonate 650 MG tablet Take 650 mg by mouth 2 (two) times daily.    . traMADol (ULTRAM) 50 MG tablet Take 50 mg by mouth every 6 (six) hours as needed for moderate pain.    . vitamin C (ASCORBIC ACID) 500 MG tablet Take 500 mg by mouth daily.     No current facility-administered medications on file prior to visit.     PHYSICAL EXAMINATION:   Vital signs are  Filed Vitals:   04/21/15 1002 04/21/15 1006  BP: 180/93 189/90  Pulse: 76 71  Temp: 97 F (36.1 C)   TempSrc: Oral   Resp: 16   Height:  (1.702 m)   Weight: 211 lb (95.709 kg)   SpO2: 100%    Body mass index is 33.04 kg/(m^2). General: The patient appears their stated age.  excellent thrill within his right basilic vein fistula.  There is a less than 1 cm area that has not completely healed within one of his incisions.  There is no vein exposed.  I cauterized this with silver nitrate.   Assessment:  chronic renal insufficiency Plan:  the patient has an excellent feeling right basilic vein transposition. Once the incision completely heals, this will be ready for use.  He will contact me if the incision is not healed within the next 4-6 weeks. Otherwise he will follow up on an as-needed basis  V. Charlena Cross, M.D. [facility] Office: [phone] Pager:  [phone]

## 2015-04-22 ENCOUNTER — Telehealth: Payer: Self-pay | Admitting: Surgery

## 2015-04-22 NOTE — Telephone Encounter (Addendum)
ppw faxed back to facility  ----- Message from Fredrich Birks sent at 04/21/2015  2:47 PM EST ----- Regarding: FW: RED FOLDER Does anyone know of the whereabouts of the red folder described below. I do not have any information on Mr Baller. Please let me know if you have the information to be faxed.  Thank you! Annabelle Harman ----- Message -----    From: Norval Gable    Sent: 04/21/2015  11:36 AM      To: Fredrich Birks Subject: RED FOLDER                                     Good Morning,   Marilu Favre, Mining engineer, called at 45:40 on 04/21/2015 to say he left the red folder that belongs to the patient above. He is asking that the information inside be fax to [facility]. The phone number to them is [phone]. He said the fax is [phone]. Marilu Favre states that when one call ask for Aggie Cosier, Engineer, site, because he will not be around. The red folder should have the patients next appointment, update on his health and etc.   Thanks,  Anastasio Champion

## 2017-03-03 ENCOUNTER — Ambulatory Visit (INDEPENDENT_AMBULATORY_CARE_PROVIDER_SITE_OTHER): Payer: Medicare Other | Admitting: Vascular Surgery

## 2017-03-03 ENCOUNTER — Encounter: Payer: Self-pay | Admitting: Vascular Surgery

## 2017-03-03 VITALS — BP 124/76 | HR 67 | Temp 99.3°F | Resp 20 | Ht 67.0 in | Wt 211.0 lb

## 2017-03-03 DIAGNOSIS — T82898A Other specified complication of vascular prosthetic devices, implants and grafts, initial encounter: Secondary | ICD-10-CM | POA: Diagnosis not present

## 2017-03-03 NOTE — H&P (View-Only) (Signed)
  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 male with a right basilic vein transposition fistula previously placed by my partner Dr. Brabham January 2017.  Few months ago he had angioplasty of his subclavian and innominate vein for poor flows in his fistula.  Subsequently he developed numbness tingling and aching in his right hand with symptoms consistent with steal.  He describes aching of the right hand which occurs primarily in the evenings.  It improves somewhat with running warm water over it.  He also states he gets numbness and aching in the right hand while on dialysis.  His current dialysis day is Monday Wednesday Friday.  He is on Eliquis for atrial fibrillation.  He has had a prior stroke and has contracture of his left arm.  Other medical problems include COPD, depression, diabetes, hyperlipidemia, hypertension, sleep apnea all of which have been stable.  He lives at [facility] and [place].  Past Medical History:  Diagnosis Date  . Atrial fibrillation (HCC)   . CKD (chronic kidney disease)   . COPD (chronic obstructive pulmonary disease) (HCC)   . Depression   . Diabetes mellitus without complication (HCC)   . Hyperlipidemia   . Hypertension   . OSA (obstructive sleep apnea)   . Peripheral vascular disease (HCC)    Left side  . Stroke (HCC)    Past Surgical History:  Procedure Laterality Date  . AV FISTULA PLACEMENT Right 01/03/2015   Procedure: ARTERIOVENOUS (AV) FISTULA CREATION;  Surgeon: Vance W Brabham, MD;  Location: [facility];  Service: Vascular;  Laterality: Right;  . BASCILIC VEIN TRANSPOSITION Right 03/14/2015   Procedure: Right Arm SECOND STAGE BASILIC VEIN TRANSPOSITION;  Surgeon: Vance W Brabham, MD;  Location: [facility] OR;  Service: Vascular;  Laterality: Right;    Family  History  Problem Relation Age of Onset  . Stroke Mother   . Diabetes Mother     SOCIAL HISTORY: Social History   Socioeconomic History  . Marital status: Legally Separated    Spouse name: Not on file  . Number of children: Not on file  . Years of education: Not on file  . Highest education level: Not on file  Social Needs  . Financial resource strain: Not on file  . Food insecurity - worry: Not on file  . Food insecurity - inability: Not on file  . Transportation needs - medical: Not on file  . Transportation needs - non-medical: Not on file  Occupational History  . Not on file  Tobacco Use  . Smoking status: Light Tobacco Smoker    Packs/day: 0.25    Types: Cigarettes  . Smokeless tobacco: Never Used  . Tobacco comment: 5 cigarettes per day  Substance and Sexual Activity  . Alcohol use: No    Alcohol/week: 0.0 oz  . Drug use: No  . Sexual activity: Not on file  Other Topics Concern  . Not on file  Social History Narrative  . Not on file    No Known Allergies  Current Outpatient Medications  Medication Sig Dispense Refill  . acetaminophen (TYLENOL) 500 MG tablet Take 500 mg by mouth every 4 (four) hours as needed.    . alum & mag hydroxide-simeth (MAALOX/MYLANTA) 200-200-20 MG/5ML suspension Take by mouth every 6 (six) hours as needed for indigestion or heartburn.    .   amLODipine (NORVASC) 10 MG tablet Take 10 mg by mouth daily.    . apixaban (ELIQUIS) 2.5 MG TABS tablet Take 1 tablet (2.5 mg total) by mouth 2 (two) times daily. 60 tablet 0  . bumetanide (BUMEX) 1 MG tablet Take 3 mg by mouth 2 (two) times daily.     . calcitRIOL (ROCALTROL) 0.25 MCG capsule Take 0.25 mcg by mouth daily.    . Cholecalciferol (VITAMIN D3) 2000 units TABS Take 1 tablet by mouth daily. Reported on 04/08/2015 30 tablet 0  . cloNIDine (CATAPRES) 0.2 MG tablet Take 0.2 mg by mouth 3 (three) times daily.     . diltiazem (DILACOR XR) 240 MG 24 hr capsule Take 240 mg by mouth daily.    .  docusate sodium (COLACE) 100 MG capsule Take 100 mg by mouth 2 (two) times daily.    . doxycycline (VIBRA-TABS) 100 MG tablet Take 1 tablet (100 mg total) by mouth every 12 (twelve) hours. 14 tablet 0  . ferrous sulfate 325 (65 FE) MG tablet Take 325 mg by mouth 3 (three) times daily with meals.     . guaiFENesin (ROBITUSSIN) 100 MG/5ML liquid Take 200 mg by mouth 4 (four) times daily as needed for cough.    . hydrALAZINE (APRESOLINE) 100 MG tablet Take 100 mg by mouth 4 (four) times daily.    . insulin aspart (NOVOLOG) 100 UNIT/ML injection Inject 8 Units into the skin 3 (three) times daily after meals.     . insulin glargine (LANTUS) 100 UNIT/ML injection Inject 25 Units into the skin at bedtime.     . magnesium hydroxide (MILK OF MAGNESIA) 400 MG/5ML suspension Take 30 mLs by mouth at bedtime as needed for mild constipation.    . Melatonin 5 MG TABS Take 1 tablet by mouth at bedtime. Reported on 04/08/2015    . metoprolol (LOPRESSOR) 100 MG tablet Take 100 mg by mouth 2 (two) times daily.    . mirtazapine (REMERON) 15 MG tablet Take 15 mg by mouth at bedtime.    . oxyCODONE (ROXICODONE) 5 MG immediate release tablet Take 1 tablet (5 mg total) by mouth every 4 (four) hours as needed for severe pain. 30 tablet 0  . potassium chloride SA (K-DUR,KLOR-CON) 20 MEQ tablet Take 20 mEq by mouth daily.    . pramipexole (MIRAPEX) 0.5 MG tablet Take 0.5 mg by mouth every evening.    . sodium bicarbonate 650 MG tablet Take 650 mg by mouth 2 (two) times daily.    . traMADol (ULTRAM) 50 MG tablet Take 50 mg by mouth every 6 (six) hours as needed for moderate pain.    . vitamin C (ASCORBIC ACID) 500 MG tablet Take 500 mg by mouth daily.     No current facility-administered medications for this visit.     ROS:   General:  No weight loss, Fever, chills  Cardiac: No recent episodes of chest pain/pressure, no shortness of breath at rest.  + shortness of breath with exertion.  Denies history of atrial  fibrillation or irregular heartbeat    Physical Examination  Vitals:   03/03/17 1319  BP: 124/76  Pulse: 67  Resp: 20  Temp: 99.3 F (37.4 C)  TempSrc: Oral  SpO2: 94%  Weight: 211 lb (95.7 kg)  Height: 5' 7" (1.702 m)    Body mass index is 33.05 kg/m.  General:  Alert and oriented, no acute distress HEENT: Normal Pulmonary: Clear to auscultation bilaterally Cardiac: Regular Rate and Rhythm  Skin:   No rash Extremity Pulses:  2+ left radial, absent right radial palpable thrill right upper arm AV fistula Musculoskeletal: Severe left shoulder elbow and wrist contracture Neurologic: Upper and lower extremity motor 5/5 right side unable to move left upper extremity    ASSESSMENT: Patient with ischemic steal right hand after improved outflow from angioplasty of his subclavian and innominate vein.  Unfortunately the patient's left arm is not a very good access due to the severe contracture from a prior stroke.  Due to his overall debility I do not believe he is a very good candidate for a total DRIL procedure.  I believe his best option would be to band the fistula to try to diminish its flow somewhat.  Hopefully this will improve his symptoms.  I discussed with the patient today that this may lead to thrombosis of his fistula.  If that occurs.  We would most likely end up placing a temporary dialysis catheter and then consider whether or not to place a thigh graft.  PLAN: Banding of his right arm AV fistula scheduled for March 22, 2017.  We will stop his Eliquis 2 days prior and bridge him since he has had a prior history of stroke.   Crissie Aloi, MD [facility] Office: [phone] Pager: [phone]   

## 2017-03-03 NOTE — Progress Notes (Signed)
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 male with a right basilic vein transposition fistula previously placed by my partner Dr. Myra GianottiBrabham January 2017.  Few months ago he had angioplasty of his subclavian and innominate vein for poor flows in his fistula.  Subsequently he developed numbness tingling and aching in his right hand with symptoms consistent with steal.  He describes aching of the right hand which occurs primarily in the evenings.  It improves somewhat with running warm water over it.  He also states he gets numbness and aching in the right hand while on dialysis.  His current dialysis day is Monday Wednesday Friday.  He is on Eliquis for atrial fibrillation.  He has had a prior stroke and has contracture of his left arm.  Other medical problems include COPD, depression, diabetes, hyperlipidemia, hypertension, sleep apnea all of which have been stable.  [facility] lives at [facility] and [place].  Past Medical History:  Diagnosis Date  . Atrial fibrillation (HCC)   . CKD (chronic kidney disease)   . COPD (chronic obstructive pulmonary disease) (HCC)   . Depression   . Diabetes mellitus without complication (HCC)   . Hyperlipidemia   . Hypertension   . OSA (obstructive sleep apnea)   . Peripheral vascular disease (HCC)    Left side  . Stroke [facility](HCC)    Past Surgical History:  Procedure Laterality Date  . AV FISTULA PLACEMENT Right 01/03/2015   Procedure: ARTERIOVENOUS (AV) FISTULA CREATION;  Surgeon: Nada LibmanVance W Brabham, MD;  Location: [facility] OR;  Service: Vascular;  Laterality: Right;  . BASCILIC VEIN TRANSPOSITION Right 03/14/2015   Procedure: Right Arm SECOND STAGE BASILIC VEIN TRANSPOSITION;  Surgeon: Nada LibmanVance W Brabham, MD;  Location: [facility] OR;  Service: Vascular;  Laterality: Right;    Family  History  Problem Relation Age of Onset  . Stroke Mother   . Diabetes Mother     SOCIAL HISTORY: Social History   Socioeconomic History  . Marital status: Legally Separated    Spouse name: Not on file  . Number of children: Not on file  . Years of education: Not on file  . Highest education level: Not on file  Social Needs  . Financial resource strain: Not on file  . Food insecurity - worry: Not on file  . Food insecurity - inability: Not on file  . Transportation needs - medical: Not on file  . Transportation needs - non-medical: Not on file  Occupational History  . Not on file  Tobacco Use  . Smoking status: Light Tobacco Smoker    Packs/day: 0.25    Types: Cigarettes  . Smokeless tobacco: Never Used  . Tobacco comment: 5 cigarettes per day  Substance and Sexual Activity  . Alcohol use: No    Alcohol/week: 0.0 oz  . Drug use: No  . Sexual activity: Not on file  Other Topics Concern  . Not on file  Social History Narrative  . Not on file    No Known Allergies  Current Outpatient Medications  Medication Sig Dispense Refill  . acetaminophen (TYLENOL) 500 MG tablet Take 500 mg by mouth every 4 (four) hours as needed.    Marland Kitchen. alum & mag hydroxide-simeth (MAALOX/MYLANTA) 200-200-20 MG/5ML suspension Take by mouth every 6 (six) hours as needed for indigestion or heartburn.    .Marland Kitchen  amLODipine (NORVASC) 10 MG tablet Take 10 mg by mouth daily.    Marland Kitchen apixaban (ELIQUIS) 2.5 MG TABS tablet Take 1 tablet (2.5 mg total) by mouth 2 (two) times daily. 60 tablet 0  . bumetanide (BUMEX) 1 MG tablet Take 3 mg by mouth 2 (two) times daily.     . calcitRIOL (ROCALTROL) 0.25 MCG capsule Take 0.25 mcg by mouth daily.    . Cholecalciferol (VITAMIN D3) 2000 units TABS Take 1 tablet by mouth daily. Reported on 04/08/2015 30 tablet 0  . cloNIDine (CATAPRES) 0.2 MG tablet Take 0.2 mg by mouth 3 (three) times daily.     Marland Kitchen diltiazem (DILACOR XR) 240 MG 24 hr capsule Take 240 mg by mouth daily.    Marland Kitchen  docusate sodium (COLACE) 100 MG capsule Take 100 mg by mouth 2 (two) times daily.    Marland Kitchen doxycycline (VIBRA-TABS) 100 MG tablet Take 1 tablet (100 mg total) by mouth every 12 (twelve) hours. 14 tablet 0  . ferrous sulfate 325 (65 FE) MG tablet Take 325 mg by mouth 3 (three) times daily with meals.     Marland Kitchen guaiFENesin (ROBITUSSIN) 100 MG/5ML liquid Take 200 mg by mouth 4 (four) times daily as needed for cough.    . hydrALAZINE (APRESOLINE) 100 MG tablet Take 100 mg by mouth 4 (four) times daily.    . insulin aspart (NOVOLOG) 100 UNIT/ML injection Inject 8 Units into the skin 3 (three) times daily after meals.     . insulin glargine (LANTUS) 100 UNIT/ML injection Inject 25 Units into the skin at bedtime.     . magnesium hydroxide (MILK OF MAGNESIA) 400 MG/5ML suspension Take 30 mLs by mouth at bedtime as needed for mild constipation.    . Melatonin 5 MG TABS Take 1 tablet by mouth at bedtime. Reported on 04/08/2015    . metoprolol (LOPRESSOR) 100 MG tablet Take 100 mg by mouth 2 (two) times daily.    . mirtazapine (REMERON) 15 MG tablet Take 15 mg by mouth at bedtime.    Marland Kitchen oxyCODONE (ROXICODONE) 5 MG immediate release tablet Take 1 tablet (5 mg total) by mouth every 4 (four) hours as needed for severe pain. 30 tablet 0  . potassium chloride SA (K-DUR,KLOR-CON) 20 MEQ tablet Take 20 mEq by mouth daily.    . pramipexole (MIRAPEX) 0.5 MG tablet Take 0.5 mg by mouth every evening.    . sodium bicarbonate 650 MG tablet Take 650 mg by mouth 2 (two) times daily.    . traMADol (ULTRAM) 50 MG tablet Take 50 mg by mouth every 6 (six) hours as needed for moderate pain.    . vitamin C (ASCORBIC ACID) 500 MG tablet Take 500 mg by mouth daily.     No current facility-administered medications for this visit.     ROS:   General:  No weight loss, Fever, chills  Cardiac: No recent episodes of chest pain/pressure, no shortness of breath at rest.  + shortness of breath with exertion.  Denies history of atrial  fibrillation or irregular heartbeat    Physical Examination  Vitals:   03/03/17 1319  BP: 124/76  Pulse: 67  Resp: 20  Temp: 99.3 F (37.4 C)  TempSrc: Oral  SpO2: 94%  Weight: 211 lb (95.7 kg)  Height: 5\' 7"  (1.702 m)    Body mass index is 33.05 kg/m.  General:  Alert and oriented, no acute distress HEENT: Normal Pulmonary: Clear to auscultation bilaterally Cardiac: Regular Rate and Rhythm  Skin:  No rash Extremity Pulses:  2+ left radial, absent right radial palpable thrill right upper arm AV fistula Musculoskeletal: Severe left shoulder elbow and wrist contracture Neurologic: Upper and lower extremity motor 5/5 right side unable to move left upper extremity    ASSESSMENT: Patient with ischemic steal right hand after improved outflow from angioplasty of his subclavian and innominate vein.  Unfortunately the patient's left arm is not a very good access due to the severe contracture from a prior stroke.  Due to his overall debility I do not believe he is a very good candidate for a total DRIL procedure.  I believe his best option would be to band the fistula to try to diminish its flow somewhat.  Hopefully this will improve his symptoms.  I discussed with the patient today that this may lead to thrombosis of his fistula.  If that occurs.  We would most likely end up placing a temporary dialysis catheter and then consider whether or not to place a thigh graft.  PLAN: Banding of his right arm AV fistula scheduled for March 22, 2017.  We will stop his Eliquis 2 days prior and bridge him since he has had a prior history of stroke.   Fabienne Bruns, MD [facility] Office: [phone] Pager: [phone]

## 2017-03-11 ENCOUNTER — Other Ambulatory Visit: Payer: Self-pay | Admitting: *Deleted

## 2017-03-11 ENCOUNTER — Encounter: Payer: Self-pay | Admitting: *Deleted

## 2017-03-28 ENCOUNTER — Encounter (HOSPITAL_COMMUNITY): Payer: Self-pay | Admitting: *Deleted

## 2017-03-28 ENCOUNTER — Other Ambulatory Visit: Payer: Self-pay

## 2017-03-28 NOTE — Progress Notes (Signed)
Derrick GrossKay, RN from Dr. Darrick PennaFields' office called me back after talking with Aram Beechamynthia at [facility] and Aram BeechamCynthia stated that pt's last dose of Eliquis was Saturday PM. He was bridged with Lovenox, Sunday and today by Adriana SimasMatthew Ward, PA.

## 2017-03-28 NOTE — Progress Notes (Signed)
[facility]. Spoke with Marcelino DusterMichelle, med tech. She had difficulty finding updated MAR to find out when pt's last dose of Eliquis. After much searching she found that pt's last dose was today at 9 AM and then hold as of 1:00 PM today. She states she sees no order for Lovenox bridging and states that they cannot give Lovenox. Pt is a type 2 diabetic. She states his blood sugar is running less than 149 and pt has not had any of his Novolog insulin for several days because they were instructed not to give Novolog if blood sugar was less than 149. She then states he's not been receiving his Levemir insulin at night because of the same reason. I asked her if that was an order not to give Levemir for a certain CBG. She stated no it's not, but "these people drop their blood sugar so quick, we just don't give it"  I asked her if she realized Levemir was a long acting insulin and usually pt don't "drop their sugar" that quickly after receiving the injection. She stated she was aware of that but they still don't give it if the CBG is less than 149. I instructed her to give 12 units tonight (half of his regular dose of Levemir). Instructed her to check his blood sugar in the AM and every 2 hour until he leaves for the hospital.If blood sugar is 70 or below, treat with 1/2 cup of clear juice (apple or cranberry) or with glucose tabs and recheck blood sugar 15 minutes after drinking juice. She voiced understanding. I called and spoke with Joyce GrossKay, RN at Dr. Darrick PennaFields' office and notified her of Eliquis not being held until after his 9 AM dose this am.

## 2017-03-29 ENCOUNTER — Encounter (HOSPITAL_COMMUNITY): Payer: Self-pay | Admitting: General Practice

## 2017-03-29 ENCOUNTER — Encounter (HOSPITAL_COMMUNITY)
Admission: RE | Disposition: A | Discharge status: Skilled Nursing Facility | Payer: Self-pay | Source: Ambulatory Visit | Attending: Vascular Surgery

## 2017-03-29 ENCOUNTER — Telehealth: Payer: Self-pay | Admitting: *Deleted

## 2017-03-29 ENCOUNTER — Ambulatory Visit (HOSPITAL_COMMUNITY): Payer: Medicare Other | Admitting: Anesthesiology

## 2017-03-29 ENCOUNTER — Ambulatory Visit (HOSPITAL_COMMUNITY)
Admission: RE | Admit: 2017-03-29 | Discharge: 2017-03-29 | Disposition: A | Discharge status: Skilled Nursing Facility | Payer: Medicare Other | Source: Ambulatory Visit | Attending: Vascular Surgery | Admitting: Vascular Surgery

## 2017-03-29 DIAGNOSIS — N186 End stage renal disease: Secondary | ICD-10-CM | POA: Diagnosis not present

## 2017-03-29 DIAGNOSIS — T82898A Other specified complication of vascular prosthetic devices, implants and grafts, initial encounter: Secondary | ICD-10-CM | POA: Diagnosis present

## 2017-03-29 DIAGNOSIS — F329 Major depressive disorder, single episode, unspecified: Secondary | ICD-10-CM | POA: Diagnosis not present

## 2017-03-29 DIAGNOSIS — E1122 Type 2 diabetes mellitus with diabetic chronic kidney disease: Secondary | ICD-10-CM | POA: Insufficient documentation

## 2017-03-29 DIAGNOSIS — T82858A Stenosis of vascular prosthetic devices, implants and grafts, initial encounter: Secondary | ICD-10-CM | POA: Insufficient documentation

## 2017-03-29 DIAGNOSIS — E1151 Type 2 diabetes mellitus with diabetic peripheral angiopathy without gangrene: Secondary | ICD-10-CM | POA: Diagnosis not present

## 2017-03-29 DIAGNOSIS — Z7901 Long term (current) use of anticoagulants: Secondary | ICD-10-CM | POA: Diagnosis not present

## 2017-03-29 DIAGNOSIS — Z8673 Personal history of transient ischemic attack (TIA), and cerebral infarction without residual deficits: Secondary | ICD-10-CM | POA: Insufficient documentation

## 2017-03-29 DIAGNOSIS — F17210 Nicotine dependence, cigarettes, uncomplicated: Secondary | ICD-10-CM | POA: Insufficient documentation

## 2017-03-29 DIAGNOSIS — Y832 Surgical operation with anastomosis, bypass or graft as the cause of abnormal reaction of the patient, or of later complication, without mention of misadventure at the time of the procedure: Secondary | ICD-10-CM | POA: Diagnosis not present

## 2017-03-29 DIAGNOSIS — I4891 Unspecified atrial fibrillation: Secondary | ICD-10-CM | POA: Diagnosis not present

## 2017-03-29 DIAGNOSIS — Z794 Long term (current) use of insulin: Secondary | ICD-10-CM | POA: Diagnosis not present

## 2017-03-29 DIAGNOSIS — I120 Hypertensive chronic kidney disease with stage 5 chronic kidney disease or end stage renal disease: Secondary | ICD-10-CM | POA: Insufficient documentation

## 2017-03-29 DIAGNOSIS — G4733 Obstructive sleep apnea (adult) (pediatric): Secondary | ICD-10-CM | POA: Diagnosis not present

## 2017-03-29 DIAGNOSIS — E785 Hyperlipidemia, unspecified: Secondary | ICD-10-CM | POA: Insufficient documentation

## 2017-03-29 DIAGNOSIS — J449 Chronic obstructive pulmonary disease, unspecified: Secondary | ICD-10-CM | POA: Diagnosis not present

## 2017-03-29 HISTORY — DX: Unspecified dementia, unspecified severity, without behavioral disturbance, psychotic disturbance, mood disturbance, and anxiety: F03.90

## 2017-03-29 HISTORY — PX: LIGATION OF COMPETING BRANCHES OF ARTERIOVENOUS FISTULA: SHX5949

## 2017-03-29 LAB — GLUCOSE, CAPILLARY
GLUCOSE-CAPILLARY: 96 mg/dL (ref 65–99)
GLUCOSE-CAPILLARY: 90 mg/dL (ref 65–99)
Glucose-Capillary: 96 mg/dL (ref 65–99)

## 2017-03-29 LAB — PROTIME-INR
INR: 1.08
Prothrombin Time: 13.9 seconds (ref 11.4–15.2)

## 2017-03-29 LAB — POCT I-STAT 4, (NA,K, GLUC, HGB,HCT)
Glucose, Bld: 111 mg/dL — ABNORMAL HIGH (ref 65–99)
HCT: 39 % (ref 39.0–52.0)
Hemoglobin: 13.3 g/dL (ref 13.0–17.0)
Potassium: 3.6 mmol/L (ref 3.5–5.1)
Sodium: 138 mmol/L (ref 135–145)

## 2017-03-29 LAB — SURGICAL PCR SCREEN
MRSA, PCR: POSITIVE — AB
STAPHYLOCOCCUS AUREUS: POSITIVE — AB

## 2017-03-29 SURGERY — LIGATION OF COMPETING BRANCHES OF ARTERIOVENOUS FISTULA
Anesthesia: General | Site: Arm Upper | Laterality: Right

## 2017-03-29 MED ORDER — OXYCODONE HCL 5 MG PO TABS: 5.0000 mg | ORAL_TABLET | Freq: Once | ORAL | Status: DC | PRN

## 2017-03-29 MED ORDER — PROPOFOL 10 MG/ML IV BOLUS
INTRAVENOUS | Status: DC | PRN
  Administered 2017-03-29: 110 mg via INTRAVENOUS
  Administered 2017-03-29: 20 mg via INTRAVENOUS
  Administered 2017-03-29: 40 mg via INTRAVENOUS

## 2017-03-29 MED ORDER — 0.9 % SODIUM CHLORIDE (POUR BTL) OPTIME
TOPICAL | Status: DC | PRN
  Administered 2017-03-29: 1000 mL

## 2017-03-29 MED ORDER — SODIUM CHLORIDE 0.9 % IV SOLN
INTRAVENOUS | Status: DC
  Administered 2017-03-29: 08:00:00 via INTRAVENOUS

## 2017-03-29 MED ORDER — PHENYLEPHRINE HCL 10 MG/ML IJ SOLN
INTRAMUSCULAR | Status: DC | PRN
  Administered 2017-03-29: 80 ug via INTRAVENOUS

## 2017-03-29 MED ORDER — FENTANYL CITRATE (PF) 100 MCG/2ML IJ SOLN
INTRAMUSCULAR | Status: DC | PRN
  Administered 2017-03-29 (×2): 50 ug via INTRAVENOUS

## 2017-03-29 MED ORDER — FENTANYL CITRATE (PF) 100 MCG/2ML IJ SOLN: 25.0000 ug | INTRAMUSCULAR | Status: DC | PRN

## 2017-03-29 MED ORDER — HEPARIN SODIUM (PORCINE) 5000 UNIT/ML IJ SOLN
INTRAMUSCULAR | Status: DC | PRN
  Administered 2017-03-29: 10:00:00

## 2017-03-29 MED ORDER — ONDANSETRON HCL 4 MG/2ML IJ SOLN
INTRAMUSCULAR | Status: DC | PRN
  Administered 2017-03-29: 4 mg via INTRAVENOUS

## 2017-03-29 MED ORDER — TRAMADOL HCL 50 MG PO TABS: 50.0000 mg | ORAL_TABLET | Freq: Four times a day (QID) | ORAL | 0 refills | Status: AC | PRN

## 2017-03-29 MED ORDER — DEXTROSE 5 % IV SOLN
1.5000 g | INTRAVENOUS | Status: AC
  Administered 2017-03-29: 1.5 g via INTRAVENOUS
  Filled 2017-03-29: qty 1.5

## 2017-03-29 MED ORDER — FENTANYL CITRATE (PF) 250 MCG/5ML IJ SOLN
INTRAMUSCULAR | Status: AC
Start: 2017-03-29 — End: ?
  Filled 2017-03-29: qty 5

## 2017-03-29 MED ORDER — ONDANSETRON HCL 4 MG/2ML IJ SOLN: 4.0000 mg | Freq: Four times a day (QID) | INTRAMUSCULAR | Status: DC | PRN

## 2017-03-29 MED ORDER — OXYCODONE HCL 5 MG/5ML PO SOLN: 5.0000 mg | Freq: Once | ORAL | Status: DC | PRN

## 2017-03-29 MED ORDER — LIDOCAINE 2% (20 MG/ML) 5 ML SYRINGE
INTRAMUSCULAR | Status: DC | PRN
  Administered 2017-03-29: 60 mg via INTRAVENOUS

## 2017-03-29 SURGICAL SUPPLY — 27 items
ARMBAND PINK RESTRICT EXTREMIT (MISCELLANEOUS) ×2 IMPLANT
CANISTER SUCT 3000ML PPV (MISCELLANEOUS) ×2 IMPLANT
COVER PROBE W GEL 5X96 (DRAPES) IMPLANT
DERMABOND ADVANCED (GAUZE/BANDAGES/DRESSINGS) ×1
DERMABOND ADVANCED .7 DNX12 (GAUZE/BANDAGES/DRESSINGS) ×1 IMPLANT
ELECT REM PT RETURN 9FT ADLT (ELECTROSURGICAL) ×2
ELECTRODE REM PT RTRN 9FT ADLT (ELECTROSURGICAL) ×1 IMPLANT
GLOVE BIO SURGEON STRL SZ7.5 (GLOVE) ×2 IMPLANT
GOWN STRL REUS W/ TWL LRG LVL3 (GOWN DISPOSABLE) ×3 IMPLANT
GOWN STRL REUS W/TWL LRG LVL3 (GOWN DISPOSABLE) ×3
GRAFT GORETEX 6X10 (Vascular Products) ×2 IMPLANT
HEMOSTAT SPONGE AVITENE ULTRA (HEMOSTASIS) IMPLANT
KIT BASIN OR (CUSTOM PROCEDURE TRAY) ×2 IMPLANT
KIT ROOM TURNOVER OR (KITS) ×2 IMPLANT
LOOP VESSEL MINI RED (MISCELLANEOUS) IMPLANT
NS IRRIG 1000ML POUR BTL (IV SOLUTION) ×2 IMPLANT
PACK CV ACCESS (CUSTOM PROCEDURE TRAY) ×2 IMPLANT
PAD ARMBOARD 7.5X6 YLW CONV (MISCELLANEOUS) ×4 IMPLANT
SUT PROLENE 5 0 C 1 24 (SUTURE) ×2 IMPLANT
SUT PROLENE 6 0 CC (SUTURE) IMPLANT
SUT SILK 0 (SUTURE) IMPLANT
SUT VIC AB 3-0 SH 27 (SUTURE) ×1
SUT VIC AB 3-0 SH 27X BRD (SUTURE) ×1 IMPLANT
SUT VICRYL 4-0 PS2 18IN ABS (SUTURE) ×2 IMPLANT
TOWEL GREEN STERILE (TOWEL DISPOSABLE) ×2 IMPLANT
UNDERPAD 30X30 (UNDERPADS AND DIAPERS) ×2 IMPLANT
WATER STERILE IRR 1000ML POUR (IV SOLUTION) ×2 IMPLANT

## 2017-03-29 NOTE — Op Note (Signed)
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  The patient was placed in supine position operating table.  After induction of general anesthesia and placement of a laryngeal mask, the patient's entire right upper extremity was prepped and draped in usual sterile fashion.  Next a oblique incision was made through a pre-existing scar adjacent to the right antecubital crease.  Incision was carried down through the subtendinous tissues down the level of the pre-existing basilic vein fistula.  This was about 10 mm in diameter.  It was dissected free circumferentially.  A piece of PTFE material was then wrapped around the fistula and snugged down to about 4 mm diameter.  The edges were then sewn together using a running 5-0 Prolene suture.  At this point the fistula still had flow but the thrill was diminished.  The patient also had a palpable radial pulse.  The subcutaneous tissues were reapproximated using a running 3-0 Vicryl suture.  Skin was closed with a 4-0 Vicryl subcuticular stitch.  Dermabond was applied.  The patient tolerated the procedure well and there were no complications.  If the fistula occludes he is not a very good candidate for placement of a left arm access secondary to contracture from a prior stroke.  If the fistula occludes or has inadequate flow consideration will need to be given for placement of a dialysis catheter plus or minus a thigh graft.  Fabienne Brunsharles Tierria Watson, MD [facility] [place] Office: [phone] Pager: [phone]

## 2017-03-29 NOTE — Progress Notes (Signed)
Spoke with microlab regarding patient's surgical PCR that was MRSA positive.  Patient has been [facility].  Patient does not have a pharmacy listed.  [facility] [place] [facility] (Ph) [phone].  Will call in prescription for Mupirocin.

## 2017-03-29 NOTE — Anesthesia Postprocedure Evaluation (Signed)
Anesthesia Post Note  Patient: Derrick Howell  Procedure(s) Performed: BANDING OF RIGHT ARTERIOVENOUS FISTULA (Right Arm Upper)     Patient location during evaluation: PACU Anesthesia Type: General Level of consciousness: awake and alert Pain management: pain level controlled Vital Signs Assessment: post-procedure vital signs reviewed and stable Respiratory status: spontaneous breathing, nonlabored ventilation, respiratory function stable and patient connected to nasal cannula oxygen Cardiovascular status: blood pressure returned to baseline and stable Postop Assessment: no apparent nausea or vomiting Anesthetic complications: no    Last Vitals:  Vitals:   03/29/17 1045 03/29/17 1055  BP: (!) 156/61 (!) 152/53  Pulse: 84 82  Resp:    Temp:  36.6 C  SpO2: 99% 98%    Last Pain:  Vitals:   03/29/17 1055  TempSrc:   PainSc: 0-No pain                 Marine Lezotte S

## 2017-03-29 NOTE — Telephone Encounter (Signed)
-----   Message from Sherren Kernsharles E Fields, MD sent at 03/29/2017 10:16 AM EST ----- Banding right arm AV fistula  If HD center calls with poor access flow or occlusion of fistula he needs a catheter followed by a full outpt eval to see if he is a candidate for a thigh graft.  Fabienne Brunsharles Fields, MD [facility] [place] Office: [phone] Pager: [phone]

## 2017-03-29 NOTE — Progress Notes (Signed)
Good bruit thrill present right arm fistula

## 2017-03-29 NOTE — Telephone Encounter (Signed)
Staff message for future reference

## 2017-03-29 NOTE — Interval H&P Note (Signed)
History and Physical Interval Note:  03/29/2017 9:02 AM  Flavia ShipperJames H Adrian  has presented today for surgery, with the diagnosis of complication of fistula  The various methods of treatment have been discussed with the patient and family. After consideration of risks, benefits and other options for treatment, the patient has consented to  Banding right arm AV fistula as a surgical intervention .  The patient's history has been reviewed, patient examined, no change in status, stable for surgery.  I have reviewed the patient's chart and labs.  Questions were answered to the patient's satisfaction.     Fabienne Brunsharles Chizaram Latino

## 2017-03-29 NOTE — Anesthesia Preprocedure Evaluation (Signed)
Anesthesia Evaluation  Patient identified by MRN, date of birth, ID band Patient awake    Reviewed: Allergy & Precautions, NPO status , Patient's Chart, lab work & pertinent test results  Airway Mallampati: II   Neck ROM: full    Dental   Pulmonary sleep apnea , COPD, Current Smoker,    breath sounds clear to auscultation       Cardiovascular hypertension, + Peripheral Vascular Disease  + dysrhythmias Atrial Fibrillation  Rhythm:regular Rate:Normal     Neuro/Psych PSYCHIATRIC DISORDERS Depression CVA    GI/Hepatic   Endo/Other  diabetes, Type 2  Renal/GU ESRFRenal disease     Musculoskeletal   Abdominal   Peds  Hematology   Anesthesia Other Findings   Reproductive/Obstetrics                             Anesthesia Physical Anesthesia Plan  ASA: III  Anesthesia Plan: General   Post-op Pain Management:    Induction: Intravenous  PONV Risk Score and Plan: 1 and Ondansetron and Treatment may vary due to age or medical condition  Airway Management Planned: LMA  Additional Equipment:   Intra-op Plan:   Post-operative Plan:   Informed Consent: I have reviewed the patients History and Physical, chart, labs and discussed the procedure including the risks, benefits and alternatives for the proposed anesthesia with the patient or authorized representative who has indicated his/her understanding and acceptance.     Plan Discussed with: CRNA, Anesthesiologist and Surgeon  Anesthesia Plan Comments:         Anesthesia Quick Evaluation

## 2017-03-29 NOTE — Progress Notes (Signed)
Prescription for mupirocin 2% ointment called to [facility] [phone].

## 2017-03-29 NOTE — Transfer of Care (Signed)
Immediate Anesthesia Transfer of Care Note  Patient: Derrick ShipperJames H Howell  Procedure(s) Performed: BANDING OF RIGHT ARTERIOVENOUS FISTULA (Right Arm Upper)  Patient Location: PACU  Anesthesia Type:General  Level of Consciousness: awake, alert  and confused  Airway & Oxygen Therapy: Patient Spontanous Breathing and Patient connected to nasal cannula oxygen  Post-op Assessment: Report given to RN and Post -op Vital signs reviewed and stable  Post vital signs: Reviewed and stable  Last Vitals:  Vitals:   03/29/17 0647  BP: (!) 151/46  Pulse: (!) 58  Resp: 16  Temp: 37.2 C  SpO2: 92%    Last Pain:  Vitals:   03/29/17 0742  TempSrc:   PainSc: 8       Patients Stated Pain Goal: 3 (03/29/17 0742)  Complications: No apparent anesthesia complications

## 2017-03-29 NOTE — Anesthesia Procedure Notes (Signed)
Procedure Name: LMA Insertion Date/Time: 03/29/2017 9:23 AM Performed by: Sheppard EvensManess, Demarea Lorey B, CRNA Pre-anesthesia Checklist: Patient identified, Emergency Drugs available, Suction available and Patient being monitored Patient Re-evaluated:Patient Re-evaluated prior to induction Oxygen Delivery Method: Circle System Utilized Preoxygenation: Pre-oxygenation with 100% oxygen Induction Type: IV induction Ventilation: Mask ventilation without difficulty LMA: LMA inserted LMA Size: 5.0 Number of attempts: 1 Placement Confirmation: positive ETCO2 Tube secured with: Tape Dental Injury: Teeth and Oropharynx as per pre-operative assessment

## 2017-03-29 NOTE — Progress Notes (Signed)
Medication list reviewed with nurse, DeLois, [facility].  DeLois also stated that patient has had nothing to eat or drink since midnight.

## 2017-03-30 ENCOUNTER — Telehealth: Payer: Self-pay | Admitting: Vascular Surgery

## 2017-03-30 ENCOUNTER — Encounter (HOSPITAL_COMMUNITY): Payer: Self-pay | Admitting: Vascular Surgery

## 2017-03-30 ENCOUNTER — Other Ambulatory Visit: Payer: Self-pay | Admitting: *Deleted

## 2017-03-30 NOTE — Telephone Encounter (Signed)
Sched appt 04/21/17 at 9:45. Spoke to [employer] [employer] at [facility] listed as the pt's hm #. They did not have transportation that day, resched for 04/14/17 at 2:30. Pt is in a wheelchair and is cognizant. Lm on the pt's first emergency conact's # to inform them of appt.

## 2017-03-30 NOTE — Telephone Encounter (Signed)
-----   Message from Sharee PimpleMarilyn K McChesney, RN sent at 03/29/2017 11:27 AM EST ----- Regarding: 2 weeks   ----- Message ----- From: Forestine NaEveland, Matthew, PA-C Sent: 03/29/2017  10:15 AM To: Vvs Charge Pool  Can you schedule an appt with Dr. Darrick PennaFields in about 2 weeks.  PO R arm AV fistula banding. Thanks, [employer] ChemicalMatt

## 2017-04-14 ENCOUNTER — Ambulatory Visit (INDEPENDENT_AMBULATORY_CARE_PROVIDER_SITE_OTHER): Payer: Medicare Other | Admitting: Vascular Surgery

## 2017-04-14 ENCOUNTER — Encounter: Payer: Self-pay | Admitting: Vascular Surgery

## 2017-04-14 VITALS — BP 105/61 | HR 64 | Temp 98.4°F | Resp 18 | Ht 67.0 in | Wt 210.0 lb

## 2017-04-14 DIAGNOSIS — Z992 Dependence on renal dialysis: Secondary | ICD-10-CM

## 2017-04-14 DIAGNOSIS — N186 End stage renal disease: Secondary | ICD-10-CM

## 2017-04-14 NOTE — Progress Notes (Signed)
Patient is a 72 year old male who returns for follow-up today.  He recently underwent banding of his right arm AV fistula due to ischemic steal.  He still has a scant amount of drainage from his antecubital incision.  There is an audible bruit in the fistula.  He is currently smoking and I discussed with him quitting this to try to get his hand to heal up.  He states the numbness and tingling has resolved.  He still has a wound on his finger.  Images are depicted below.      Assessment: Improving status post banding of right arm AV fistula.  Plan: Follow-up 1 month.  If his hand has not completely healed at that point we may need to consider ligation of the fistula.  However, clinically his symptoms seem to be improved.  He will try to quit smoking.  Fabienne Brunsharles Fields, MD [facility] [place] Office: [phone] Pager: [phone]

## 2017-04-21 ENCOUNTER — Encounter: Payer: Medicare Other | Admitting: Vascular Surgery

## 2017-04-30 ENCOUNTER — Other Ambulatory Visit: Payer: Self-pay

## 2017-04-30 ENCOUNTER — Emergency Department (HOSPITAL_COMMUNITY)
Admission: EM | Admit: 2017-04-30 | Discharge: 2017-04-30 | Disposition: A | Discharge status: Home / Self Care | Payer: Medicare Other | Attending: Emergency Medicine | Admitting: Emergency Medicine

## 2017-04-30 ENCOUNTER — Emergency Department (HOSPITAL_COMMUNITY): Payer: Medicare Other

## 2017-04-30 ENCOUNTER — Encounter (HOSPITAL_COMMUNITY): Payer: Self-pay | Admitting: Emergency Medicine

## 2017-04-30 DIAGNOSIS — I4891 Unspecified atrial fibrillation: Secondary | ICD-10-CM | POA: Diagnosis not present

## 2017-04-30 DIAGNOSIS — S0990XA Unspecified injury of head, initial encounter: Secondary | ICD-10-CM | POA: Diagnosis not present

## 2017-04-30 DIAGNOSIS — E785 Hyperlipidemia, unspecified: Secondary | ICD-10-CM | POA: Insufficient documentation

## 2017-04-30 DIAGNOSIS — W010XXA Fall on same level from slipping, tripping and stumbling without subsequent striking against object, initial encounter: Secondary | ICD-10-CM | POA: Insufficient documentation

## 2017-04-30 DIAGNOSIS — E1122 Type 2 diabetes mellitus with diabetic chronic kidney disease: Secondary | ICD-10-CM | POA: Diagnosis not present

## 2017-04-30 DIAGNOSIS — Y939 Activity, unspecified: Secondary | ICD-10-CM | POA: Insufficient documentation

## 2017-04-30 DIAGNOSIS — Z8673 Personal history of transient ischemic attack (TIA), and cerebral infarction without residual deficits: Secondary | ICD-10-CM | POA: Diagnosis not present

## 2017-04-30 DIAGNOSIS — Z794 Long term (current) use of insulin: Secondary | ICD-10-CM | POA: Insufficient documentation

## 2017-04-30 DIAGNOSIS — Y929 Unspecified place or not applicable: Secondary | ICD-10-CM | POA: Diagnosis not present

## 2017-04-30 DIAGNOSIS — F0390 Unspecified dementia without behavioral disturbance: Secondary | ICD-10-CM | POA: Insufficient documentation

## 2017-04-30 DIAGNOSIS — Z79899 Other long term (current) drug therapy: Secondary | ICD-10-CM | POA: Diagnosis not present

## 2017-04-30 DIAGNOSIS — J449 Chronic obstructive pulmonary disease, unspecified: Secondary | ICD-10-CM | POA: Insufficient documentation

## 2017-04-30 DIAGNOSIS — F17210 Nicotine dependence, cigarettes, uncomplicated: Secondary | ICD-10-CM | POA: Insufficient documentation

## 2017-04-30 DIAGNOSIS — N184 Chronic kidney disease, stage 4 (severe): Secondary | ICD-10-CM | POA: Insufficient documentation

## 2017-04-30 DIAGNOSIS — Y998 Other external cause status: Secondary | ICD-10-CM | POA: Insufficient documentation

## 2017-04-30 DIAGNOSIS — Z7901 Long term (current) use of anticoagulants: Secondary | ICD-10-CM | POA: Insufficient documentation

## 2017-04-30 DIAGNOSIS — I129 Hypertensive chronic kidney disease with stage 1 through stage 4 chronic kidney disease, or unspecified chronic kidney disease: Secondary | ICD-10-CM | POA: Diagnosis not present

## 2017-04-30 DIAGNOSIS — W19XXXA Unspecified fall, initial encounter: Secondary | ICD-10-CM

## 2017-04-30 NOTE — Discharge Instructions (Signed)
As discussed, your evaluation today has been largely reassuring.  But, it is important that you monitor your condition carefully, and do not hesitate to return to the ED if you develop new, or concerning changes in your condition. ? ?Otherwise, please follow-up with your physician for appropriate ongoing care. ? ?

## 2017-04-30 NOTE — ED Notes (Signed)
Call to C com for transport to [facility]

## 2017-04-30 NOTE — ED Notes (Signed)
To rad 

## 2017-04-30 NOTE — ED Notes (Signed)
Report to Michelle, RN

## 2017-04-30 NOTE — ED Triage Notes (Signed)
Fell off of toilet  Hematoma to forehead   No LOC  Possible wound to buttocks

## 2017-04-30 NOTE — ED Notes (Signed)
Dr L in to assess 

## 2017-04-30 NOTE — ED Provider Notes (Signed)
[facility] EMERGENCY DEPARTMENT Provider Note   CSN: [id number] Arrival date & time: 04/30/17  1425     History   Chief Complaint Chief Complaint  Patient presents with  . Fall    HPI Derrick Howell is a 72 y.o. male.  HPI Patient presents after a fall. Patient notes that he was in his usual state of health prior to the fall, recalls the entire event. Acknowledges multiple medical issues including prior stroke, baseline left-sided hemiparesis, nonambulatory status. He was sitting, when he felt unsteady, fell to the ground, striking his head, backside. Since that time he has had pain on the right lateral scalp, and posterior. No new weakness, no confusion, no vomiting, no vision changes.  No medication taken for pain relief. Pain is sore in both areas, moderate. Past Medical History:  Diagnosis Date  . Atrial fibrillation (HCC)   . CKD (chronic kidney disease)   . COPD (chronic obstructive pulmonary disease) (HCC)   . Dementia   . Depression   . Diabetes mellitus without complication (HCC)   . Hyperlipidemia   . Hypertension   . OSA (obstructive sleep apnea)   . Peripheral vascular disease (HCC)    Left side  . Stroke [facility](HCC)     Patient Active Problem List   Diagnosis Date Noted  . Obstructive chronic bronchitis without exacerbation (HCC) 04/08/2015  . Hypertension, renal 04/08/2015  . Atrial fibrillation (HCC) 04/08/2015  . Anemia of renal disease 04/08/2015  . Type 2 diabetes mellitus with diabetic nephropathy, with long-term current use of insulin (HCC) 04/08/2015  . History of hemorrhagic stroke with residual hemiparesis (HCC) 04/08/2015  . Cellulitis 04/08/2015  . AV fistula infection (HCC)   . Chronic kidney disease (CKD), stage IV (severe) (HCC) 12/23/2014    Past Surgical History:  Procedure Laterality Date  . AV FISTULA PLACEMENT Right 01/03/2015   Procedure: ARTERIOVENOUS (AV) FISTULA CREATION;  Surgeon: Nada LibmanVance W Brabham, MD;  Location: [facility] OR;   Service: Vascular;  Laterality: Right;  . BASCILIC VEIN TRANSPOSITION Right 03/14/2015   Procedure: Right Arm SECOND STAGE BASILIC VEIN TRANSPOSITION;  Surgeon: Nada LibmanVance W Brabham, MD;  Location: [facility] OR;  Service: Vascular;  Laterality: Right;  . HIP FRACTURE SURGERY Left   . LIGATION OF COMPETING BRANCHES OF ARTERIOVENOUS FISTULA Right 03/29/2017   Procedure: BANDING OF RIGHT ARTERIOVENOUS FISTULA;  Surgeon: Sherren KernsFields, Charles E, MD;  [facility];  Service: Vascular;  Laterality: Right;       Home Medications    Prior to Admission medications   Medication Sig Start Date End Date Taking? Authorizing Provider  acetaminophen (TYLENOL) 500 MG tablet Take 500 mg by mouth every 4 (four) hours as needed for moderate pain, fever or headache.    Yes [provider]  alum & mag hydroxide-simeth (MAALOX/MYLANTA) 200-200-20 MG/5ML suspension Take 30 mLs by mouth every 6 (six) hours as needed for indigestion or heartburn.    Yes [provider]  amLODipine (NORVASC) 10 MG tablet Take 10 mg by mouth daily.   Yes [provider]  apixaban (ELIQUIS) 2.5 MG TABS tablet Take 1 tablet (2.5 mg total) by mouth 2 (two) times daily. 03/15/15  Yes Maris Bergerrinh, Kimberly A, PA-C  bumetanide (BUMEX) 1 MG tablet Take 3 mg by mouth See admin instructions. Take 3 mg by mouth once daily on Monday, Wednesday and Friday. Take 3 mg by mouth twice daily on all other days.   Yes [provider]  Cholecalciferol (VITAMIN D3) 2000 units TABS Take  1 tablet by mouth daily. Reported on 04/08/2015 Patient taking differently: Take 2,000 Units by mouth daily. Reported on 04/08/2015 04/14/15  Yes Penny Pia, MD  cloNIDine (CATAPRES) 0.2 MG tablet Take 0.2 mg by mouth See admin instructions. Take 0.2 mg by mouth 3 times daily. Take 0.2 mg by mouth every 6 hours as needed fr BP greater than or equal to 200/100   Yes [provider]  dicyclomine (BENTYL) 20 MG tablet Take 20 mg by mouth every 6 (six) hours  as needed for spasms.   Yes [provider]  dimethicone 1 % cream Apply 1 application topically 2 (two) times daily.   Yes [provider]  docusate sodium (COLACE) 100 MG capsule Take 100 mg by mouth 3 (three) times daily.    Yes [provider]  glucose 4 GM chewable tablet Chew 5 tablets by mouth as needed for low blood sugar.   Yes [provider]  guaiFENesin (ROBITUSSIN) 100 MG/5ML liquid Take 200 mg by mouth every 6 (six) hours as needed for cough.    Yes [provider]  hydrALAZINE (APRESOLINE) 100 MG tablet Take 100 mg by mouth 4 (four) times daily.   Yes [provider]  insulin aspart (NOVOLOG) 100 UNIT/ML injection Inject 12 Units into the skin 3 (three) times daily after meals.    Yes [provider]  insulin detemir (LEVEMIR) 100 UNIT/ML injection Inject 25 Units into the skin at bedtime.   Yes [provider]  loperamide (IMODIUM) 2 MG capsule Take 2 mg by mouth as needed for diarrhea or loose stools (Not to exceed 8 doses in 24 hours).   Yes [provider]  Melatonin 5 MG TABS Take 5 mg by mouth at bedtime. Reported on 04/08/2015   Yes [provider]  metoprolol (LOPRESSOR) 100 MG tablet Take 100 mg by mouth 2 (two) times daily.   Yes [provider]  mirtazapine (REMERON) 15 MG tablet Take 15 mg by mouth at bedtime.   Yes [provider]  Neomycin-Bacitracin-Polymyxin (TRIPLE ANTIBIOTIC) [phone] OINT Apply 1 application topically as needed (for wound care).   Yes [provider]  ondansetron (ZOFRAN) 8 MG tablet Take 8 mg by mouth every 8 (eight) hours as needed for nausea or vomiting.   Yes [provider]  oxyCODONE (ROXICODONE) 5 MG immediate release tablet Take 1 tablet (5 mg total) by mouth every 4 (four) hours as needed for severe pain. 03/14/15  Yes Raymond Gurney, PA-C  pramipexole (MIRAPEX) 0.5 MG tablet Take 0.5 mg by mouth at bedtime.     Yes [provider]  rOPINIRole (REQUIP) 1 MG tablet Take 1 mg by mouth at bedtime.   Yes [provider]  sevelamer carbonate (RENVELA) 800 MG tablet Take 800 mg by mouth 3 (three) times daily with meals.   Yes [provider]  traMADol (ULTRAM) 50 MG tablet Take 50 mg by mouth every 6 (six) hours as needed for moderate pain.   Yes [provider]    Family History Family History  Problem Relation Age of Onset  . Stroke Mother   . Diabetes Mother     Social History Social History   Tobacco Use  . Smoking status: Light Tobacco Smoker    Packs/day: 0.25    Types: Cigarettes  . Smokeless tobacco: Never Used  . Tobacco comment: 5 cigarettes per day  Substance Use Topics  . Alcohol use: No    Alcohol/week: 0.0 oz  .  Drug use: No     Allergies   Patient has no known allergies.   Review of Systems Review of Systems  Constitutional:       Per HPI, otherwise negative  HENT:       Per HPI, otherwise negative  Respiratory:       Per HPI, otherwise negative  Cardiovascular:       Per HPI, otherwise negative  Gastrointestinal: Negative for vomiting.  Endocrine:       Negative aside from HPI  Genitourinary:       Neg aside from HPI   Musculoskeletal:       Per HPI, otherwise negative  Skin: Negative.   Neurological: Positive for weakness. Negative for syncope.     Physical Exam Updated Vital Signs BP 113/61 (BP Location: Left Arm)   Pulse 67   Temp 97.8 F (36.6 C) (Oral)   Resp 18   Ht 5\' 7"  (1.702 m)   Wt 95.3 kg (210 lb)   SpO2 100%   BMI 32.89 kg/m   Physical Exam  Constitutional: He is oriented to person, place, and time. He has a sickly appearance. No distress.  HENT:  Head: Normocephalic.    Eyes: Conjunctivae and EOM are normal.  Neck: No spinous process tenderness and no muscular tenderness present. No neck rigidity. No edema, no erythema and normal range of motion present.  Cardiovascular: Normal rate and  regular rhythm.  Pulmonary/Chest: Effort normal. No stridor. No respiratory distress.  Abdominal: He exhibits no distension.  Musculoskeletal: He exhibits no deformity.  Neurological: He is alert and oriented to person, place, and time.  Which is clear, brief. Left-sided hemiparesis, patient notes no change in upper and lower extremity strength. He moves the right side spontaneously, has appropriate strength upper and lower extremity.   Skin: Skin is warm and dry.  Psychiatric: He has a normal mood and affect.  Nursing note and vitals reviewed.    ED Treatments / Results  Labs (all labs ordered are listed, but only abnormal results are displayed) Labs Reviewed - No data to display  EKG  EKG Interpretation None       Radiology Dg Lumbar Spine Complete  Result Date: 04/30/2017 CLINICAL DATA:  Fall today.  Head injury.  Initial encounter. EXAM: LUMBAR SPINE - COMPLETE 4+ VIEW COMPARISON:  None. FINDINGS: Limited oblique and lateral radiographs. There is no visible fracture deformity or subluxation. L4-5 and L5-S1 degenerative facet spurring. Good disc height preservation for age. Osteopenic appearance. Prior left femoral neck fracture repair. IMPRESSION: No acute finding. Electronically Signed   By: Marnee Spring M.D.   On: 04/30/2017 16:23   Ct Head Wo Contrast  Result Date: 04/30/2017 CLINICAL DATA:  Fall from toilet with scalp hematoma and pain, initial encounter EXAM: CT HEAD WITHOUT CONTRAST TECHNIQUE: Contiguous axial images were obtained from the base of the skull through the vertex without intravenous contrast. COMPARISON:  None. FINDINGS: Brain: There are changes consistent with prior right MCA infarct with encephalomalacia in. No findings to suggest acute hemorrhage, acute infarct or space-occupying mass lesion are noted. Vascular: No hyperdense vessel or unexpected calcification. Skull: Normal. Negative for fracture or focal lesion. Sinuses/Orbits: No acute finding. Other:  Minimal right parietal scalp hematoma is noted. IMPRESSION: Changes of prior right middle cerebral artery infarct. Mild right parietal scalp hematoma. No acute abnormality noted. Electronically Signed   By: Alcide Clever M.D.   On: 04/30/2017 16:03    Procedures Procedures (including critical care time)  Medications Ordered in ED Medications - No data to display   Initial Impression / Assessment and Plan / ED Course  I have reviewed the triage vital signs and the nursing notes.  Pertinent labs & imaging results that were available during my care of the patient were reviewed by me and considered in my medical decision making (see chart for details).     4:26 PM Patient in no distress, aware of all findings, no new complaints per This elderly man with multiple medical issues including prior stroke presents after mechanical fall. Patient is full recall of the event, has no new neurologic deficits. Patient is anticoagulated, and CT scan was performed, was reassuring. Given his reassuring findings, patient is appropriate for discharge with close outpatient follow-up.  Final Clinical Impressions(s) / ED Diagnoses  Fall, initial encounter Head trauma   Gerhard Munch, MD 04/30/17 1627

## 2017-05-12 ENCOUNTER — Ambulatory Visit: Payer: Medicare Other | Admitting: Vascular Surgery

## 2017-06-22 DEATH — deceased

## 2019-07-22 IMAGING — CT CT HEAD W/O CM
4 series · 16 of 47 positions shown, 18 images · non-contrast
Comparison: None.

CLINICAL DATA: Fall from toilet with scalp hematoma and pain,
initial encounter

EXAM:
CT HEAD WITHOUT CONTRAST
TECHNIQUE: Contiguous axial images were obtained from the base of the skull
through the vertex without intravenous contrast.

[Series 2: head trauma wo · axial · 0.47mm/px · z∈[-234,-114]mm · 7 of 32 slices shown, 9 images]
[im 4/32  brain]
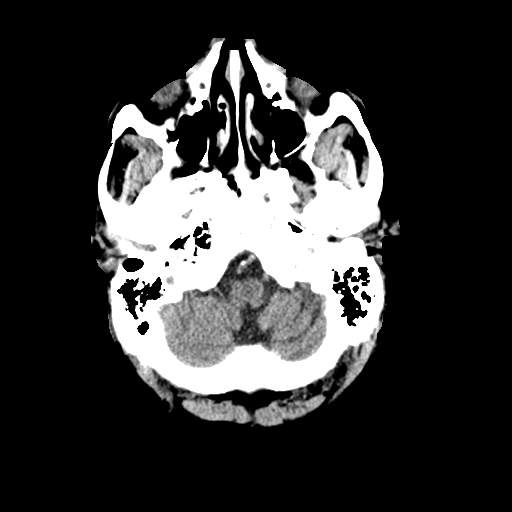
[im 4/32  bone]
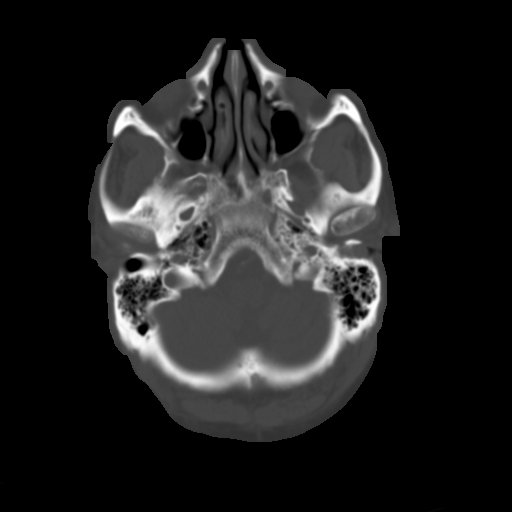
[im 8/32  brain]
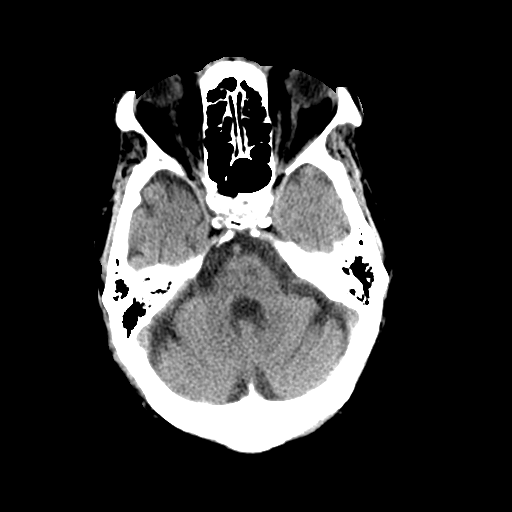
[im 12/32  brain]
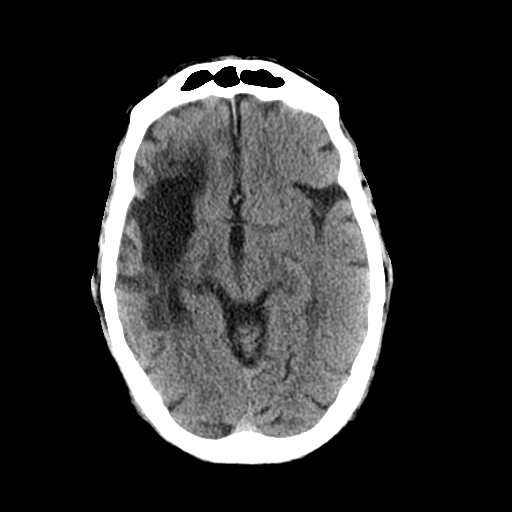
[im 16/32  brain]
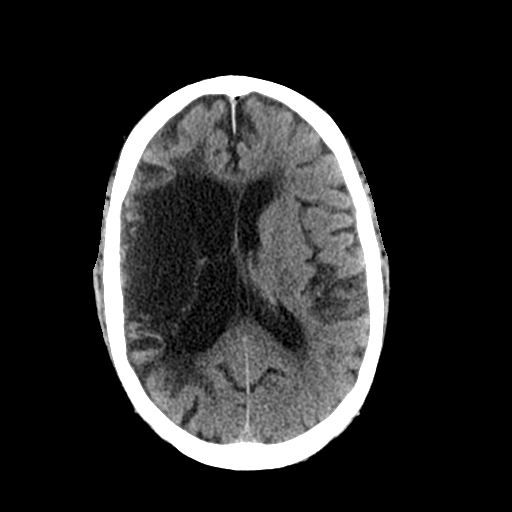
[im 20/32  brain]
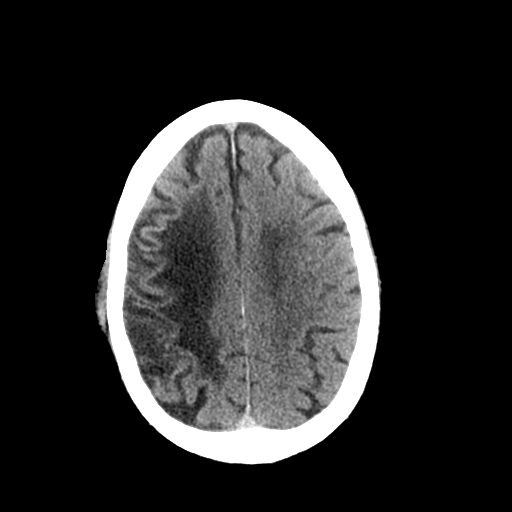
[im 20/32  bone]
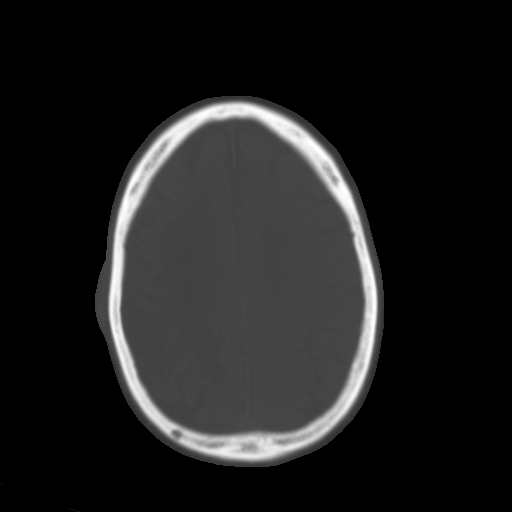
[im 24/32  brain]
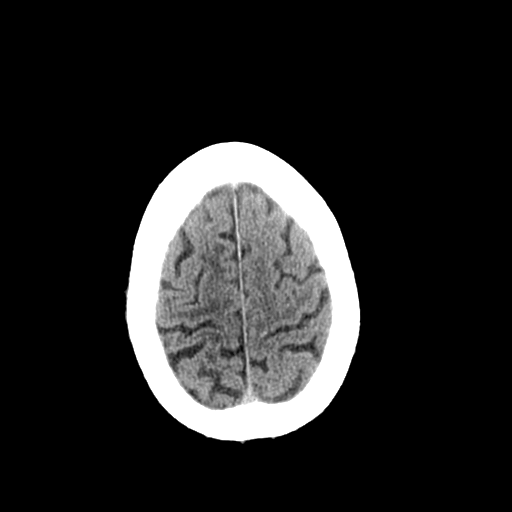
[im 28/32  brain]
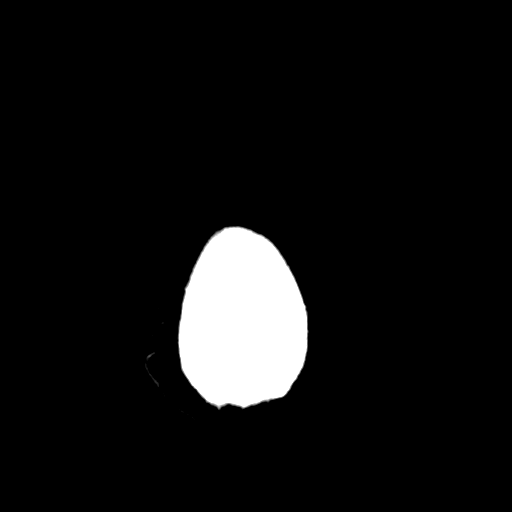

[Series 3: head bone · axial · 0.41mm/px · z∈[-238,-206]mm · 3 of 79 slices shown]
[im 8/79  bone]
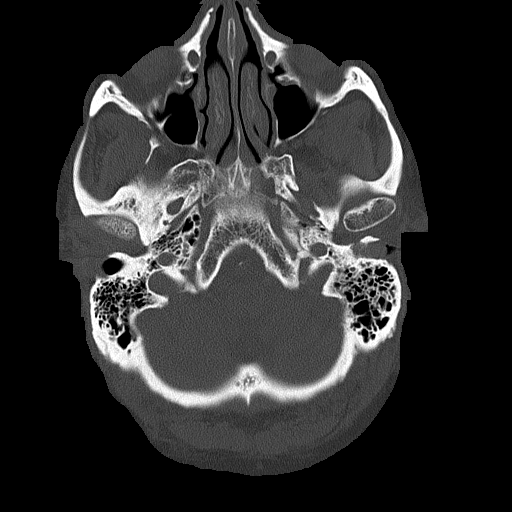
[im 16/79  bone]
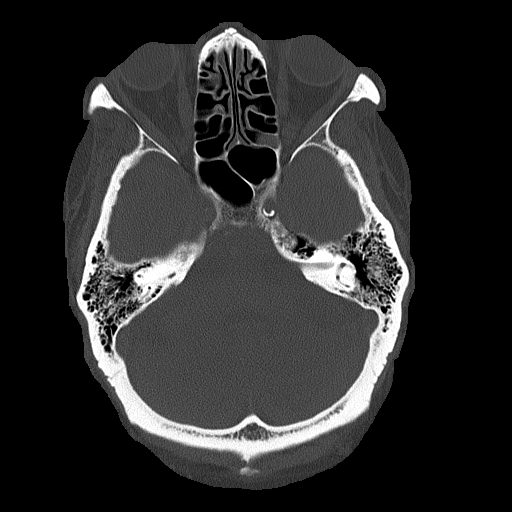
[im 24/79  bone]
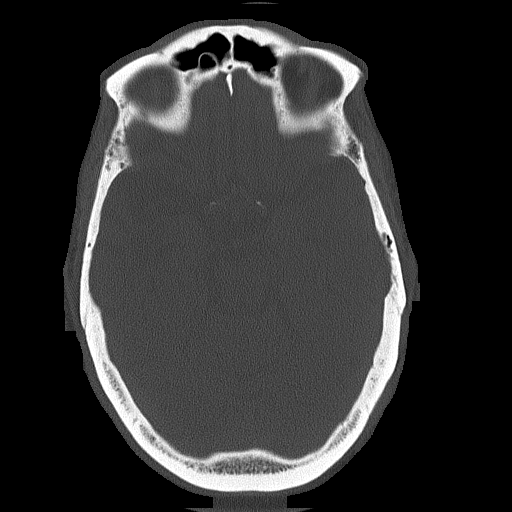

[Series 4: coronal soft tissue · coronal · 0.31mm/px · 3 of 76 slices shown]
[im 26/76  brain]
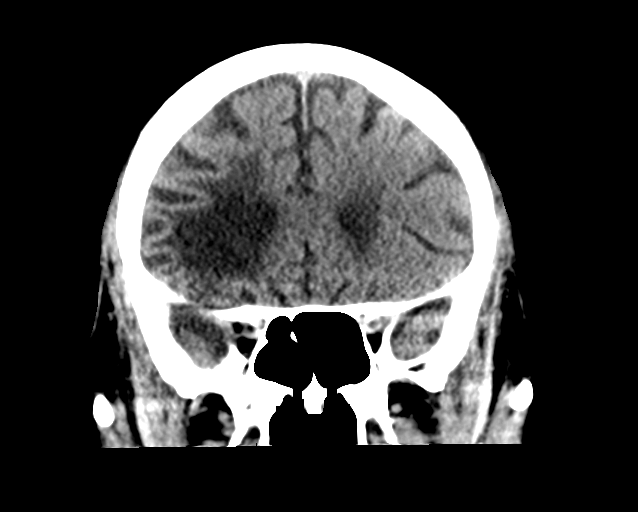
[im 34/76  brain]
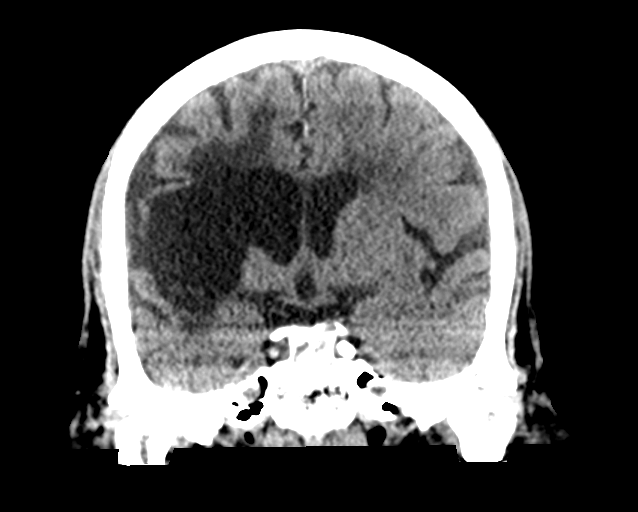
[im 42/76  brain]
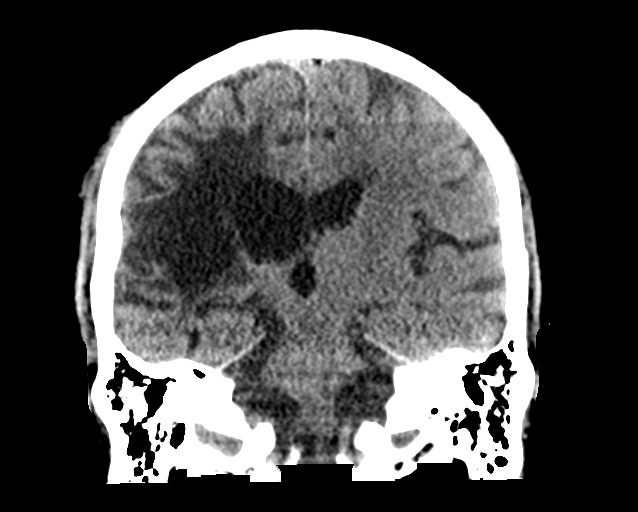

[Series 5: sagittal soft tissue · sagittal · 0.33mm/px · 3 of 57 slices shown]
[im 19/57  brain]
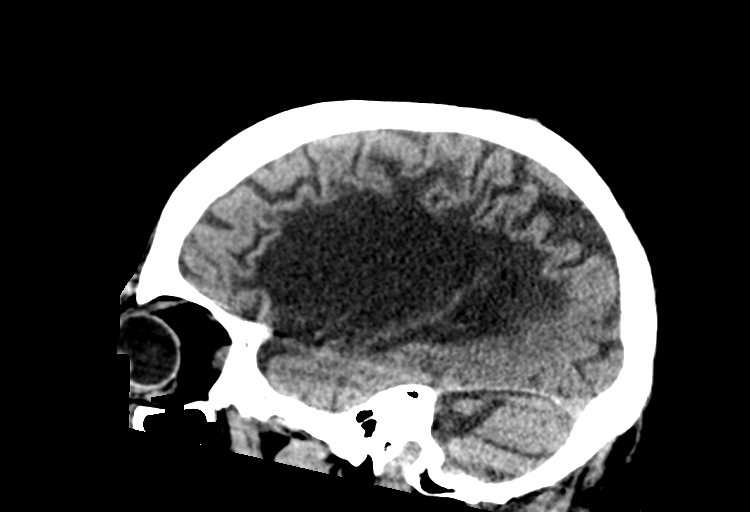
[im 29/57  brain]
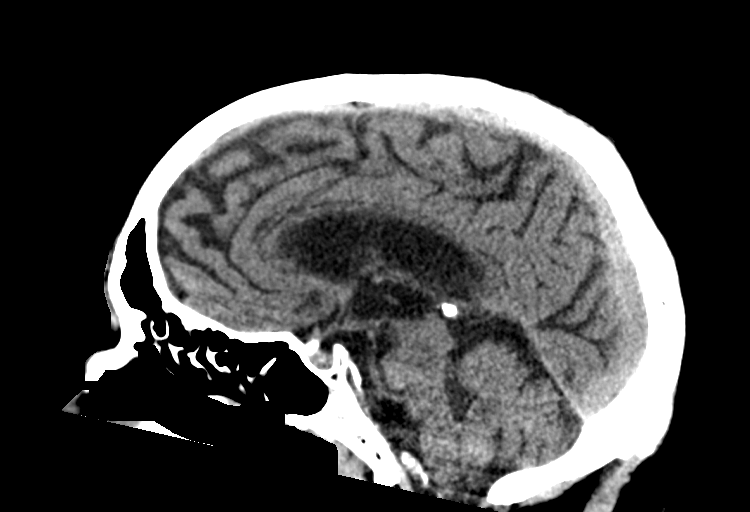
[im 38/57  brain]
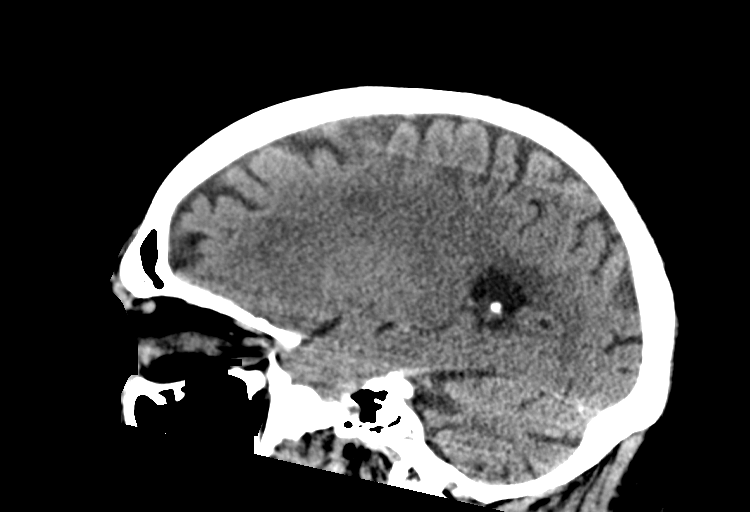

[16 of 47 positions shown; findings below may reference images not displayed]

FINDINGS: Brain: There are changes consistent with prior right MCA infarct
with encephalomalacia in. No findings to suggest acute hemorrhage,
acute infarct or space-occupying mass lesion are noted.

Vascular: No hyperdense vessel or unexpected calcification.

Skull: Normal. Negative for fracture or focal lesion.

Sinuses/Orbits: No acute finding.

Other: Minimal right parietal scalp hematoma is noted.
IMPRESSION: Changes of prior right middle cerebral artery infarct.

Mild right parietal scalp hematoma.

No acute abnormality noted.
